# Patient Record
Sex: Female | Born: 1995 | Race: Black or African American | Hispanic: No | Marital: Married | Smoking: Never smoker
Health system: Southern US, Community
[De-identification: ages and names within clinical notes are randomized; demographics above are authoritative.]

## PROBLEM LIST (undated history)

## (undated) DIAGNOSIS — J45909 Unspecified asthma, uncomplicated: Secondary | ICD-10-CM

## (undated) DIAGNOSIS — D649 Anemia, unspecified: Secondary | ICD-10-CM

## (undated) DIAGNOSIS — IMO0001 Reserved for inherently not codable concepts without codable children: Secondary | ICD-10-CM

## (undated) DIAGNOSIS — O039 Complete or unspecified spontaneous abortion without complication: Secondary | ICD-10-CM

## (undated) HISTORY — PX: DILATION AND CURETTAGE OF UTERUS: SHX78

---

## 2014-03-23 DIAGNOSIS — O99213 Obesity complicating pregnancy, third trimester: Secondary | ICD-10-CM | POA: Insufficient documentation

## 2014-12-06 DIAGNOSIS — R7309 Other abnormal glucose: Secondary | ICD-10-CM | POA: Insufficient documentation

## 2015-01-21 DIAGNOSIS — O09292 Supervision of pregnancy with other poor reproductive or obstetric history, second trimester: Secondary | ICD-10-CM | POA: Insufficient documentation

## 2015-04-12 DIAGNOSIS — R002 Palpitations: Secondary | ICD-10-CM | POA: Insufficient documentation

## 2015-07-13 DIAGNOSIS — O09299 Supervision of pregnancy with other poor reproductive or obstetric history, unspecified trimester: Secondary | ICD-10-CM | POA: Insufficient documentation

## 2018-04-23 DIAGNOSIS — B9689 Other specified bacterial agents as the cause of diseases classified elsewhere: Secondary | ICD-10-CM | POA: Insufficient documentation

## 2018-12-17 ENCOUNTER — Encounter (HOSPITAL_COMMUNITY): Payer: Self-pay | Admitting: Emergency Medicine

## 2018-12-17 ENCOUNTER — Other Ambulatory Visit: Payer: Self-pay

## 2018-12-17 ENCOUNTER — Ambulatory Visit (HOSPITAL_COMMUNITY)
Admission: EM | Admit: 2018-12-17 | Discharge: 2018-12-17 | Disposition: A | Discharge status: Home / Self Care | Payer: Self-pay

## 2018-12-17 DIAGNOSIS — B373 Candidiasis of vulva and vagina: Secondary | ICD-10-CM | POA: Insufficient documentation

## 2018-12-17 DIAGNOSIS — B3731 Acute candidiasis of vulva and vagina: Secondary | ICD-10-CM

## 2018-12-17 MED ORDER — MICONAZOLE NITRATE 1200 & 2 MG & % VA KIT: 1.0000 | PACK | Freq: Once | VAGINAL | 0 refills | Status: AC

## 2018-12-17 NOTE — Discharge Instructions (Addendum)
Use the prescribed treatment for a vaginal yeast infection as directed.  Your vaginal test results are pending; we will call you if you need additional treatment.  If any of your test results are positive, your sexual partner may need to be treated.  Do not have sexual intercourse for 7 days.    Return here or follow-up with your primary care provider if you develop difficulty with urination, abdominal pain, worsening vaginal discharge, fever, or other concerning symptoms.

## 2018-12-17 NOTE — ED Provider Notes (Signed)
[facility]    CSN: [id number] Arrival date & time: 12/17/18  1134     History   Chief Complaint Chief Complaint  Patient presents with  . Vaginal Discharge    HPI Dorrene Bently is a 23 y.o. female.   Patient presents today with white vaginal discharge, external irritation x2 months.  She states she was previously treated for bacterial vaginosis and yeast infection March 2020.  She declines a vaginal exam today or treatment for bacterial vaginosis; she would like to wait for test results to come back before treatment other than for a yeast infection.  She is sexually active in a monogamous relationship x7 years.  LMP: 11/21/2018.  The history is provided by the patient.    History reviewed. No pertinent past medical history.  There are no active problems to display for this patient.   Past Surgical History:  Procedure Laterality Date  . CESAREAN SECTION    . DILATION AND CURETTAGE OF UTERUS      OB History   No obstetric history on file.      Home Medications    Prior to Admission medications   Medication Sig Start Date End Date Taking? Authorizing Provider  albuterol (VENTOLIN HFA) 108 (90 Base) MCG/ACT inhaler Inhale into the lungs every 6 (six) hours as needed for wheezing or shortness of breath.   Yes [provider]  miconazole (MONISTAT 1 COMBINATION PACK) kit Place 1 each vaginally once for 1 dose. 12/17/18 12/17/18  Sharion Balloon, NP    Family History History reviewed. No pertinent family history.  Social History Social History   Tobacco Use  . Smoking status: Never Smoker  Substance Use Topics  . Alcohol use: Never    Frequency: Never  . Drug use: Never     Allergies   Zofran [ondansetron hcl]   Review of Systems Review of Systems  Constitutional: Negative for chills and fever.  HENT: Negative for ear pain and sore throat.   Eyes: Negative for pain and visual disturbance.  Respiratory: Negative for cough and shortness of  breath.   Cardiovascular: Negative for chest pain and palpitations.  Gastrointestinal: Negative for abdominal pain, diarrhea and vomiting.  Genitourinary: Positive for vaginal discharge. Negative for dysuria, flank pain, frequency and hematuria.  Musculoskeletal: Negative for arthralgias and back pain.  Skin: Negative for color change and rash.  Neurological: Negative for seizures and syncope.  All other systems reviewed and are negative.    Physical Exam Triage Vital Signs ED Triage Vitals  Enc Vitals Group     BP      Pulse      Resp      Temp      Temp src      SpO2      Weight      Height      Head Circumference      Peak Flow      Pain Score      Pain Loc      Pain Edu?      Excl. in [place]?    No data found.  Updated Vital Signs BP 94/68 (BP Location: Left Arm) Comment (BP Location): large cuff  Pulse 89   Temp 98.5 F (36.9 C) (Oral)   Resp 18   LMP 11/21/2018 Comment: 12/17/2018 breast feeding  SpO2 97%   Visual Acuity Right Eye Distance:   Left Eye Distance:   Bilateral Distance:    Right Eye Near:   Left  Eye Near:    Bilateral Near:     Physical Exam Vitals signs and nursing note reviewed.  Constitutional:      General: She is not in acute distress.    Appearance: She is well-developed.  HENT:     Head: Normocephalic and atraumatic.  Eyes:     Conjunctiva/sclera: Conjunctivae normal.  Neck:     Musculoskeletal: Neck supple.  Cardiovascular:     Rate and Rhythm: Normal rate and regular rhythm.     Heart sounds: No murmur.  Pulmonary:     Effort: Pulmonary effort is normal. No respiratory distress.     Breath sounds: Normal breath sounds.  Abdominal:     Palpations: Abdomen is soft.     Tenderness: There is no abdominal tenderness. There is no right CVA tenderness, left CVA tenderness, guarding or rebound.  Genitourinary:    Comments: Patient refuses vaginal exam today. Skin:    General: Skin is warm and dry.  Neurological:     Mental  Status: She is alert.      UC Treatments / Results  Labs (all labs ordered are listed, but only abnormal results are displayed) Labs Reviewed  CERVICOVAGINAL ANCILLARY ONLY    EKG   Radiology No results found.  Procedures Procedures (including critical care time)  Medications Ordered in UC Medications - No data to display  Initial Impression / Assessment and Plan / UC Course  I have reviewed the triage vital signs and the nursing notes.  Pertinent labs & imaging results that were available during my care of the patient were reviewed by me and considered in my medical decision making (see chart for details).   Vaginal yeast infection.  Treating today with miconazole vaginal combination pack.  Vaginal swab for gonorrhea, chlamydia, and bacterial vaginosis pending; patient refuses treatment today until test results return.  Discussed with patient that she will need to staying from sexual intercourse x7 days that her sexual partner will need treatment if her test results returned positive.  Discussed that she should return here or follow-up with her primary care provider if she develops dysuria, abdominal pain, worsening vaginal discharge, fever, or other concerning symptoms.   Final Clinical Impressions(s) / UC Diagnoses   Final diagnoses:  Vaginal yeast infection     Discharge Instructions     Use the prescribed treatment for a vaginal yeast infection as directed.  Your vaginal test results are pending; we will call you if you need additional treatment.  If any of your test results are positive, your sexual partner may need to be treated.  Do not have sexual intercourse for 7 days.    Return here or follow-up with your primary care provider if you develop difficulty with urination, abdominal pain, worsening vaginal discharge, fever, or other concerning symptoms.        ED Prescriptions    Medication Sig Dispense Auth. Provider   miconazole (MONISTAT 1 COMBINATION  PACK) kit Place 1 each vaginally once for 1 dose. 1 each Sharion Balloon, NP     Controlled Substance Prescriptions [place] Controlled Substance Registry consulted? Not Applicable   Sharion Balloon, NP 12/17/18 [phone]

## 2018-12-17 NOTE — ED Triage Notes (Signed)
Patient reports vaginal discharge, odor and irritation-patient reports symptoms for 2 months.

## 2018-12-19 ENCOUNTER — Telehealth (HOSPITAL_COMMUNITY): Payer: Self-pay | Admitting: Emergency Medicine

## 2018-12-19 LAB — CERVICOVAGINAL ANCILLARY ONLY
Bacterial vaginitis: POSITIVE — AB
Chlamydia: NEGATIVE
Neisseria Gonorrhea: NEGATIVE
Trichomonas: NEGATIVE

## 2018-12-19 MED ORDER — FLUCONAZOLE 150 MG PO TABS: 150.0000 mg | ORAL_TABLET | Freq: Once | ORAL | 0 refills | Status: AC

## 2018-12-19 MED ORDER — METRONIDAZOLE 0.75 % VA GEL: 1.0000 | Freq: Every day | VAGINAL | 0 refills | Status: AC

## 2018-12-19 NOTE — Telephone Encounter (Signed)
Pt requesting metrogel and diflucan, states she does not want to wait. Okay to send per Dr. Meda Coffee.

## 2019-03-16 ENCOUNTER — Ambulatory Visit (HOSPITAL_COMMUNITY)
Admission: EM | Admit: 2019-03-16 | Discharge: 2019-03-16 | Disposition: A | Discharge status: Home / Self Care | Payer: Self-pay | Attending: Emergency Medicine | Admitting: Emergency Medicine

## 2019-03-16 ENCOUNTER — Other Ambulatory Visit: Payer: Self-pay

## 2019-03-16 ENCOUNTER — Encounter (HOSPITAL_COMMUNITY): Payer: Self-pay

## 2019-03-16 ENCOUNTER — Ambulatory Visit (INDEPENDENT_AMBULATORY_CARE_PROVIDER_SITE_OTHER): Payer: Self-pay

## 2019-03-16 DIAGNOSIS — M79641 Pain in right hand: Secondary | ICD-10-CM

## 2019-03-16 HISTORY — DX: Unspecified asthma, uncomplicated: J45.909

## 2019-03-16 NOTE — ED Provider Notes (Addendum)
[facility]    CSN: [id number] Arrival date & time: 03/16/19  0845      History   Chief Complaint Chief Complaint  Patient presents with  . Fall  . Hand Injury    HPI Kara Collins is a 23 y.o. female.   Patient presents with right hand pain after falling down steps at home 2 days ago.  She denies head injury or LOC.  She reports the pain is primarily at the base of her fifth finger.  She denies numbness, tingling, weakness.  LMP: 02/17/2019, breastfeeding.  The history is provided by the patient.    Past Medical History:  Diagnosis Date  . Asthma     There are no active problems to display for this patient.   Past Surgical History:  Procedure Laterality Date  . CESAREAN SECTION    . DILATION AND CURETTAGE OF UTERUS      OB History   No obstetric history on file.      Home Medications    Prior to Admission medications   Medication Sig Start Date End Date Taking? Authorizing Provider  albuterol (VENTOLIN HFA) 108 (90 Base) MCG/ACT inhaler Inhale into the lungs every 6 (six) hours as needed for wheezing or shortness of breath.    [provider]    Family History History reviewed. No pertinent family history.  Social History Social History   Tobacco Use  . Smoking status: Never Smoker  . Smokeless tobacco: Never Used  Substance Use Topics  . Alcohol use: Never    Frequency: Never  . Drug use: Never     Allergies   Zofran [ondansetron hcl]   Review of Systems Review of Systems  Constitutional: Negative for chills and fever.  HENT: Negative for ear pain and sore throat.   Eyes: Negative for pain and visual disturbance.  Respiratory: Negative for cough and shortness of breath.   Cardiovascular: Negative for chest pain and palpitations.  Gastrointestinal: Negative for abdominal pain and vomiting.  Genitourinary: Negative for dysuria and hematuria.  Musculoskeletal: Positive for arthralgias. Negative for back pain.  Skin:  Negative for color change and rash.  Neurological: Negative for seizures and syncope.  All other systems reviewed and are negative.    Physical Exam Triage Vital Signs ED Triage Vitals  Enc Vitals Group     BP 03/16/19 0859 (!) 147/88     Pulse Rate 03/16/19 0859 70     Resp 03/16/19 0859 18     Temp 03/16/19 0859 98.2 F (36.8 C)     Temp Source 03/16/19 0859 Oral     SpO2 03/16/19 0859 96 %     Weight 03/16/19 0904 230 lb (104.3 kg)     Height --      Head Circumference --      Peak Flow --      Pain Score 03/16/19 0904 7     Pain Loc --      Pain Edu? --      Excl. in [place]? --    No data found.  Updated Vital Signs BP (!) 147/88 (BP Location: Left Arm)   Pulse 70   Temp 98.2 F (36.8 C) (Oral)   Resp 18   Wt 230 lb (104.3 kg)   LMP 02/16/2019 (Exact Date)   SpO2 96%   Visual Acuity Right Eye Distance:   Left Eye Distance:   Bilateral Distance:    Right Eye Near:   Left Eye Near:  Bilateral Near:     Physical Exam Vitals signs and nursing note reviewed.  Constitutional:      General: She is not in acute distress.    Appearance: She is well-developed.  HENT:     Head: Normocephalic and atraumatic.  Eyes:     Conjunctiva/sclera: Conjunctivae normal.     Pupils: Pupils are equal, round, and reactive to light.  Neck:     Musculoskeletal: Neck supple.  Cardiovascular:     Rate and Rhythm: Normal rate and regular rhythm.     Heart sounds: No murmur.  Pulmonary:     Effort: Pulmonary effort is normal. No respiratory distress.     Breath sounds: Normal breath sounds.  Abdominal:     Palpations: Abdomen is soft.     Tenderness: There is no abdominal tenderness.  Musculoskeletal: Normal range of motion.        General: Tenderness present. No swelling or deformity.     Comments: Right hand tender to palpation along the side from 5th finger to wrist.    Skin:    General: Skin is warm and dry.  Neurological:     General: No focal deficit present.      Mental Status: She is alert and oriented to person, place, and time.     Sensory: No sensory deficit.      UC Treatments / Results  Labs (all labs ordered are listed, but only abnormal results are displayed) Labs Reviewed - No data to display  EKG   Radiology Dg Hand Complete Right  Result Date: 03/16/2019 CLINICAL DATA:  Pain EXAM: RIGHT HAND - COMPLETE 3+ VIEW COMPARISON:  None. FINDINGS: There is no evidence of fracture or dislocation. There is no evidence of arthropathy or other focal bone abnormality. Soft tissues are unremarkable. IMPRESSION: Negative. Electronically Signed   By: Constance Holster M.D.   On: 03/16/2019 09:37    Procedures Procedures (including critical care time)  Medications Ordered in UC Medications - No data to display  Initial Impression / Assessment and Plan / UC Course  I have reviewed the triage vital signs and the nursing notes.  Pertinent labs & imaging results that were available during my care of the patient were reviewed by me and considered in my medical decision making (see chart for details).    Right hand pain.  Xray negative.  Treating with RICE; Tylenol as needed.  Instructed patient to follow-up with orthopedics if her pain continues.  Patient agrees to plan of care.     Final Clinical Impressions(s) / UC Diagnoses   Final diagnoses:  Right hand pain     Discharge Instructions     Your xray was negative.    Rest and elevate your hand.  Apply ice packs 2-3 times per day for up to 15-20 each time.  Take Tylenol as needed.    Follow up with Orthopedics as needed if your pain does not improve.        ED Prescriptions    None     PDMP not reviewed this encounter.   Sharion Balloon, NP 03/16/19 [place], [place], NP 03/16/19 [phone]

## 2019-03-16 NOTE — Discharge Instructions (Signed)
Your xray was negative.    Rest and elevate your hand.  Apply ice packs 2-3 times per day for up to 15-20 each time.  Take Tylenol as needed.    Follow up with Orthopedics as needed if your pain does not improve.

## 2019-03-16 NOTE — ED Triage Notes (Signed)
Pt states she was walking down the steps at home when she fell down and her right hand bent back causing her pain.

## 2019-07-18 ENCOUNTER — Ambulatory Visit (HOSPITAL_COMMUNITY)
Admission: EM | Admit: 2019-07-18 | Discharge: 2019-07-18 | Disposition: A | Discharge status: Home / Self Care | Payer: Self-pay | Attending: Emergency Medicine | Admitting: Emergency Medicine

## 2019-07-18 ENCOUNTER — Other Ambulatory Visit: Payer: Self-pay

## 2019-07-18 ENCOUNTER — Encounter (HOSPITAL_COMMUNITY): Payer: Self-pay | Admitting: *Deleted

## 2019-07-18 DIAGNOSIS — R1033 Periumbilical pain: Secondary | ICD-10-CM

## 2019-07-18 MED ORDER — IBUPROFEN 600 MG PO TABS: 600.0000 mg | ORAL_TABLET | Freq: Four times a day (QID) | ORAL | 0 refills | Status: DC | PRN

## 2019-07-18 NOTE — Discharge Instructions (Addendum)
Try 600 mg of ibuprofen combined with 1 g of Tylenol 3-4 times a day as needed for pain.  The abdominal binder may help as well.  Follow-up with [place] [facility] as soon as you can, go to the ER if you get worse.  Below is a list of primary care practices who are taking new patients for you to follow-up with.  [facility] internal medicine clinic Ground Floor - [facility], [address] [place], [place], [place] [address] [phone]  [facility] at [facility] [address] [place], [place] [address] [phone]  [facility] [address] [place], [place] [address] [phone]  [facility] [phone] [address] [place] [address]  [facility]: [address] [place] [address]  [phone]  [facility]: [address] [place] [address]   [phone]  [facility]: [address] [place] [address]  [phone]  [facility] : [address] [place] [address] [phone]  [facility]: [address] [place] [address] [phone]  [facility]: [address] [place] [address] [phone]  Dr. Oneal Grout [address] [facility] [place] [address]  [phone]  Dr. Jackie Plum, [facility]. [address] [place], [place] [address]  [phone]  Go to www.goodrx.com to look up your medications. This will give you a list of where you can find your prescriptions at the most affordable prices. Or ask the pharmacist what the cash price is, or if they have any other discount programs available to help make your medication more affordable. This can be less expensive than what you  would pay with insurance.

## 2019-07-18 NOTE — ED Triage Notes (Signed)
Reports having intermittent sharp umbilical pains with position changes since early 2020, following C-section in Nov 2019.  Has not been diagnosed with hernia.  States seems to be small area of swelling/bulging to umbilicus - states bulge is now getting harder over past few months.

## 2019-07-18 NOTE — ED Provider Notes (Signed)
HPI  SUBJECTIVE:  Kara Collins is a 24 y.o. female who presents with sharp, intermittent, stabbing, seconds long umbilical pain since early 2020.  It does not radiate or migrate.  States that she had a C-section in 04/2018 and her symptoms started after she was cleared 6-8 weeks later.  She reports a "hard bump" at the umbilicus for the past several months.  It has not changed today.  It has not changed in size since it appeared.  No vomiting, fevers, diarrhea, distention.  Last bowel movement was 2 days ago, but states that she is normally irregular and this is within normal stooling pattern for her.  No back, pelvic pain.  She tried an abdominal binder without improvement in her symptoms.  Symptoms are worse when she lies on her back or stomach, when she has a lot of gas, or when she does exercises that are specific to abdominal rectus separation.  She has a past medical history of low transverse C-section with resulting abdominis rectus separation.  No other abdominal surgeries.  She has history of asthma.  No History of pancreatitis, gallbladder disease, alcohol use, excessive NSAID use.  LMP: 1/16.  Denies possibility of being pregnant.  PMD: None.  Past Medical History:  Diagnosis Date  . Asthma     Past Surgical History:  Procedure Laterality Date  . CESAREAN SECTION    . DILATION AND CURETTAGE OF UTERUS      Family History  Problem Relation Age of Onset  . Other Mother   . Healthy Father     Social History   Tobacco Use  . Smoking status: Never Smoker  . Smokeless tobacco: Never Used  Substance Use Topics  . Alcohol use: Never  . Drug use: Never    No current facility-administered medications for this encounter.  Current Outpatient Medications:  .  albuterol (VENTOLIN HFA) 108 (90 Base) MCG/ACT inhaler, Inhale into the lungs every 6 (six) hours as needed for wheezing or shortness of breath., Disp: , Rfl:  .  ibuprofen (ADVIL) 600 MG tablet, Take 1 tablet (600 mg total)  by mouth every 6 (six) hours as needed., Disp: 30 tablet, Rfl: 0  Allergies  Allergen Reactions  . Cephalexin Nausea And Vomiting  . Zofran [Ondansetron Hcl] Nausea And Vomiting     ROS  As noted in HPI.   Physical Exam  BP 115/66   Pulse 75   Temp 98.4 F (36.9 C) (Oral)   Resp 16   LMP 06/27/2019 (Exact Date)   SpO2 100%   Constitutional: Well developed, well nourished, no acute distress Eyes:  EOMI, conjunctiva normal bilaterally HENT: Normocephalic, atraumatic,mucus membranes moist Respiratory: Normal inspiratory effort Cardiovascular: Normal rate GI: Normal appearance, soft.  Positive umbilical tenderness. Small hard umbilical mass.  No appreciable hernia with standing or with Valsalva.  No surrounding erythema, edema, induration.  Active bowel sounds.  No rebound, guarding.  No other abdominal tenderness.  Mild tenderness at the C-section scar. Back: No CVAT skin: No rash, skin intact Musculoskeletal: no deformities Neurologic: Alert & oriented x 3, no focal neuro deficits Psychiatric: Speech and behavior appropriate   ED Course   Medications - No data to display  No orders of the defined types were placed in this encounter.   No results found for this or any previous visit (from the past 24 hour(s)). No results found.  ED Clinical Impression  1. Umbilical pain      ED Assessment/Plan  Patient may have a small  umbilical hernia, but it sounds as if it is stable-has been present since early 2020 and has not changed over the past few months..  Abdomen is benign today.  No evidence of pancreatitis, incarceration, strangulation.  will refer to [place] [facility] for further evaluation and possible imaging to evaluate the presence or absence of an umbilical hernia.  We will also provide primary care list for routine care.  Discussed MDM, treatment plan, and plan for follow-up with patient. Discussed sn/sx that should prompt return to the ED. patient agrees with  plan.   Meds ordered this encounter  Medications  . ibuprofen (ADVIL) 600 MG tablet    Sig: Take 1 tablet (600 mg total) by mouth every 6 (six) hours as needed.    Dispense:  30 tablet    Refill:  0    *This clinic note was created using [employer]. Therefore, there may be occasional mistakes despite careful proofreading.   ?   Melynda Ripple, MD 07/18/19 1733

## 2019-09-27 ENCOUNTER — Other Ambulatory Visit: Payer: Self-pay

## 2019-09-27 ENCOUNTER — Encounter (HOSPITAL_COMMUNITY): Payer: Self-pay

## 2019-09-27 ENCOUNTER — Ambulatory Visit (HOSPITAL_COMMUNITY)
Admission: EM | Admit: 2019-09-27 | Discharge: 2019-09-27 | Disposition: A | Discharge status: Home / Self Care | Payer: Self-pay | Attending: Family Medicine | Admitting: Family Medicine

## 2019-09-27 DIAGNOSIS — R5383 Other fatigue: Secondary | ICD-10-CM | POA: Insufficient documentation

## 2019-09-27 DIAGNOSIS — Z20822 Contact with and (suspected) exposure to covid-19: Secondary | ICD-10-CM | POA: Insufficient documentation

## 2019-09-27 DIAGNOSIS — J45909 Unspecified asthma, uncomplicated: Secondary | ICD-10-CM | POA: Insufficient documentation

## 2019-09-27 DIAGNOSIS — R0602 Shortness of breath: Secondary | ICD-10-CM | POA: Insufficient documentation

## 2019-09-27 LAB — CBC WITH DIFFERENTIAL/PLATELET
Abs Immature Granulocytes: 0.01 10*3/uL (ref 0.00–0.07)
Basophils Absolute: 0 10*3/uL (ref 0.0–0.1)
Basophils Relative: 0 %
Eosinophils Absolute: 0 10*3/uL (ref 0.0–0.5)
Eosinophils Relative: 1 %
HCT: 38.2 % (ref 36.0–46.0)
Hemoglobin: 11.6 g/dL — ABNORMAL LOW (ref 12.0–15.0)
Immature Granulocytes: 0 %
Lymphocytes Relative: 26 %
Lymphs Abs: 1.3 10*3/uL (ref 0.7–4.0)
MCH: 25.1 pg — ABNORMAL LOW (ref 26.0–34.0)
MCHC: 30.4 g/dL (ref 30.0–36.0)
MCV: 82.7 fL (ref 80.0–100.0)
Monocytes Absolute: 0.3 10*3/uL (ref 0.1–1.0)
Monocytes Relative: 7 %
Neutro Abs: 3.2 10*3/uL (ref 1.7–7.7)
Neutrophils Relative %: 66 %
Platelets: 320 10*3/uL (ref 150–400)
RBC: 4.62 MIL/uL (ref 3.87–5.11)
RDW: 13.9 % (ref 11.5–15.5)
WBC: 4.9 10*3/uL (ref 4.0–10.5)
nRBC: 0 % (ref 0.0–0.2)

## 2019-09-27 MED ORDER — ALBUTEROL SULFATE HFA 108 (90 BASE) MCG/ACT IN AERS: 2.0000 | INHALATION_SPRAY | RESPIRATORY_TRACT | 0 refills | Status: AC | PRN

## 2019-09-27 NOTE — ED Triage Notes (Signed)
C/o shortness of breath in the week leading up to and the week of her menses. Reports she has irregular periods, and become very short of breath to the point she can't have a conversation with someone. Also reports she gets cold easily and feels fatigued as well. Patient reports her LMP was either 3/15 or 3/22.

## 2019-09-27 NOTE — Discharge Instructions (Addendum)
We are checking your iron levels today to rule out anemia as the cause of your fatigue.  Your COVID test is pending.  You should self quarantine until the test result is back.    Take Tylenol as needed for fever or discomfort.  Rest and keep yourself hydrated.    Go to the emergency department if you develop shortness of breath, severe diarrhea, high fever not relieved by Tylenol or ibuprofen, or other concerning symptoms.

## 2019-09-27 NOTE — ED Provider Notes (Signed)
[facility]    CSN: [id number] Arrival date & time: 09/27/19  1050      History   Chief Complaint Chief Complaint  Patient presents with  . Shortness of Breath    HPI Kara Collins is a 24 y.o. female.   Patient reports that she has been experiencing shortness of breath for the week before and the week of her menstrual cycle.  She reports that this has been going on for the last 3 months, she finished breast-feeding her youngest child 3 months ago and began having periods then.  Patient has medical history significant for asthma.  She does not take any medications for this.  Patient is not taking any birth control at this time.  Patient reports that she is getting so short of breath at work, she is on the phone all day, that she cannot complete her tasks at work.  Patient also reports easily fatigued and feeling cold easily.  She is inquiring about checking her hemoglobin today.  Reports that her last menstrual period was either on 08/24/2019 or 08/31/2018.  Denies headache, cough, nausea, vomiting, diarrhea, chills, body aches, rash, fever, other symptoms.  ROS per HPI  The history is provided by the patient.    Past Medical History:  Diagnosis Date  . Asthma     There are no problems to display for this patient.   Past Surgical History:  Procedure Laterality Date  . CESAREAN SECTION    . DILATION AND CURETTAGE OF UTERUS      OB History   No obstetric history on file.      Home Medications    Prior to Admission medications   Medication Sig Start Date End Date Taking? Authorizing Provider  albuterol (VENTOLIN HFA) 108 (90 Base) MCG/ACT inhaler Inhale 2 puffs into the lungs every 4 (four) hours as needed for wheezing or shortness of breath. 09/27/19   Moshe Cipro, NP  ibuprofen (ADVIL) 600 MG tablet Take 1 tablet (600 mg total) by mouth every 6 (six) hours as needed. 07/18/19   Domenick Gong, MD    Family History Family History  Problem  Relation Age of Onset  . Other Mother   . Healthy Father     Social History Social History   Tobacco Use  . Smoking status: Never Smoker  . Smokeless tobacco: Never Used  Substance Use Topics  . Alcohol use: Never  . Drug use: Never     Allergies   Cephalexin and Zofran [ondansetron hcl]   Review of Systems Review of Systems   Physical Exam Triage Vital Signs ED Triage Vitals  Enc Vitals Group     BP 09/27/19 1158 112/69     Pulse Rate 09/27/19 1158 65     Resp 09/27/19 1158 (!) 24     Temp 09/27/19 1158 98.7 F (37.1 C)     Temp Source 09/27/19 1158 Oral     SpO2 09/27/19 1158 100 %     Weight --      Height --      Head Circumference --      Peak Flow --      Pain Score 09/27/19 1156 0     Pain Loc --      Pain Edu? --      Excl. in GC? --    No data found.  Updated Vital Signs BP 112/69 (BP Location: Left Arm)   Pulse 65   Temp 98.7 F (37.1 C) (Oral)  Resp (!) 24   LMP 08/24/2019 (Within Days)   SpO2 100%   Visual Acuity Right Eye Distance:   Left Eye Distance:   Bilateral Distance:    Right Eye Near:   Left Eye Near:    Bilateral Near:     Physical Exam Vitals and nursing note reviewed.  Constitutional:      General: She is not in acute distress.    Appearance: She is well-developed. She is obese. She is not ill-appearing.  HENT:     Head: Normocephalic and atraumatic.     Mouth/Throat:     Mouth: Mucous membranes are moist.     Pharynx: Oropharynx is clear.  Eyes:     Extraocular Movements: Extraocular movements intact.     Conjunctiva/sclera: Conjunctivae normal.     Pupils: Pupils are equal, round, and reactive to light.  Neck:     Thyroid: No thyromegaly.     Vascular: No JVD.  Cardiovascular:     Rate and Rhythm: Normal rate and regular rhythm.     Pulses: Normal pulses.     Heart sounds: Normal heart sounds. No murmur.  Pulmonary:     Effort: Pulmonary effort is normal. Tachypnea present. No bradypnea, accessory muscle  usage or respiratory distress.     Breath sounds: Normal breath sounds. No stridor. No decreased breath sounds, wheezing, rhonchi or rales.  Chest:     Chest wall: No mass, deformity, tenderness, crepitus or edema. There is no dullness to percussion.  Abdominal:     Palpations: Abdomen is soft.     Tenderness: There is no abdominal tenderness.  Musculoskeletal:        General: Normal range of motion.     Cervical back: Normal range of motion and neck supple.  Lymphadenopathy:     Cervical: No cervical adenopathy.  Skin:    General: Skin is warm and dry.     Capillary Refill: Capillary refill takes less than 2 seconds.  Neurological:     General: No focal deficit present.     Mental Status: She is alert and oriented to person, place, and time.  Psychiatric:        Mood and Affect: Mood normal.        Behavior: Behavior normal.      UC Treatments / Results  Labs (all labs ordered are listed, but only abnormal results are displayed) Labs Reviewed  CBC WITH DIFFERENTIAL/PLATELET - Abnormal; Notable for the following components:      Result Value   Hemoglobin 11.6 (*)    MCH 25.1 (*)    All other components within normal limits  SARS CORONAVIRUS 2 (TAT 6-24 HRS)    EKG   Radiology No results found.  Procedures Procedures (including critical care time)  Medications Ordered in UC Medications - No data to display  Initial Impression / Assessment and Plan / UC Course  I have reviewed the triage vital signs and the nursing notes.  Pertinent labs & imaging results that were available during my care of the patient were reviewed by me and considered in my medical decision making (see chart for details).     Shortness of breath/fatigue: Present intermittently for the last 3 months.  We will check a CBC today to rule out anemia.  Instructed patient that she can go ahead and take over-the-counter iron supplements.  Instructed patient that this can make her constipated, so she  needs to drink plenty of water and increase fiber in her diet.  She may use MiraLAX as needed for constipation.  Albuterol inhaler prescribed for shortness of breath.  Unsure of etiology, possibly hormonal given the timing of her symptoms.  Instructed patient that she should follow-up with gynecology, as increased hormones can increase inflammation in the body.  Patient instructed to follow-up with the ER for worsening shortness of breath, high fever, extreme fatigue, other concerning symptoms.  Patient verbalizes understanding and is in agreement with treatment plan. Final Clinical Impressions(s) / UC Diagnoses   Final diagnoses:  SOB (shortness of breath)  Other fatigue     Discharge Instructions     We are checking your iron levels today to rule out anemia as the cause of your fatigue.  Your COVID test is pending.  You should self quarantine until the test result is back.    Take Tylenol as needed for fever or discomfort.  Rest and keep yourself hydrated.    Go to the emergency department if you develop shortness of breath, severe diarrhea, high fever not relieved by Tylenol or ibuprofen, or other concerning symptoms.       ED Prescriptions    Medication Sig Dispense Auth. Provider   albuterol (VENTOLIN HFA) 108 (90 Base) MCG/ACT inhaler Inhale 2 puffs into the lungs every 4 (four) hours as needed for wheezing or shortness of breath. 18 g Moshe Cipro, NP     PDMP not reviewed this encounter.   Moshe Cipro, NP 09/28/19 2103

## 2019-09-28 LAB — SARS CORONAVIRUS 2 (TAT 6-24 HRS): SARS Coronavirus 2: NEGATIVE

## 2019-12-12 ENCOUNTER — Other Ambulatory Visit: Payer: Self-pay

## 2019-12-12 ENCOUNTER — Ambulatory Visit (HOSPITAL_COMMUNITY): Admission: EM | Admit: 2019-12-12 | Discharge: 2019-12-12 | Discharge status: Against Medical Advice | Payer: Self-pay

## 2019-12-12 NOTE — ED Notes (Signed)
Pt thought she had medicaid and when told during registration she did not, she decided to leave because she did not want a bill or to do self pay.

## 2019-12-24 ENCOUNTER — Ambulatory Visit (HOSPITAL_COMMUNITY)
Admission: EM | Admit: 2019-12-24 | Discharge: 2019-12-24 | Disposition: A | Discharge status: Home / Self Care | Payer: Self-pay

## 2019-12-24 ENCOUNTER — Other Ambulatory Visit: Payer: Self-pay

## 2019-12-25 ENCOUNTER — Other Ambulatory Visit: Payer: Self-pay

## 2019-12-25 ENCOUNTER — Encounter (HOSPITAL_COMMUNITY): Payer: Self-pay

## 2019-12-25 ENCOUNTER — Ambulatory Visit (HOSPITAL_COMMUNITY)
Admission: EM | Admit: 2019-12-25 | Discharge: 2019-12-25 | Disposition: A | Discharge status: Home / Self Care | Payer: Self-pay

## 2019-12-25 ENCOUNTER — Ambulatory Visit (INDEPENDENT_AMBULATORY_CARE_PROVIDER_SITE_OTHER): Payer: Self-pay

## 2019-12-25 DIAGNOSIS — Z3202 Encounter for pregnancy test, result negative: Secondary | ICD-10-CM

## 2019-12-25 DIAGNOSIS — R42 Dizziness and giddiness: Secondary | ICD-10-CM

## 2019-12-25 DIAGNOSIS — R0602 Shortness of breath: Secondary | ICD-10-CM

## 2019-12-25 DIAGNOSIS — R140 Abdominal distension (gaseous): Secondary | ICD-10-CM

## 2019-12-25 DIAGNOSIS — R109 Unspecified abdominal pain: Secondary | ICD-10-CM

## 2019-12-25 LAB — POCT URINALYSIS DIP (DEVICE)
Bilirubin Urine: NEGATIVE
Glucose, UA: NEGATIVE mg/dL
Hgb urine dipstick: NEGATIVE
Ketones, ur: NEGATIVE mg/dL
Leukocytes,Ua: NEGATIVE
Nitrite: NEGATIVE
Protein, ur: NEGATIVE mg/dL
Specific Gravity, Urine: 1.01 (ref 1.005–1.030)
Urobilinogen, UA: 0.2 mg/dL (ref 0.0–1.0)
pH: 7.5 (ref 5.0–8.0)

## 2019-12-25 LAB — POC URINE PREG, ED: Preg Test, Ur: NEGATIVE

## 2019-12-25 NOTE — ED Provider Notes (Signed)
[facility]    CSN: [id number] Arrival date & time: 12/25/19  0801      History   Chief Complaint Chief Complaint  Patient presents with  . Shortness of Breath  . Abdominal Pain    HPI Kara Collins is a 24 y.o. female.   Patient is a 24 year old female who presents today with acute onset of mild shortness of breath since yesterday.  This is been intermittent.  Reporting associated with possible anxiety.  She has had anxiety attacks off and on over the years.  Had chest pressure for approximately 1 hour and was able to fall asleep.  She has been using her mother-in-law's inhaler for shortness of breath without any relief.  Current chest heaviness 5 out of 10.  Denies any cough, congestion, runny nose, sore throat, ear pain, nausea, vomiting, diarrhea, fever or chills.  No acute distress.  Had an abortion in June by oral pill and then had clotting after this. Patient's last menstrual period was 12/16/2019 (approximate).  She is also had some constipation and feels bloated.  No dysuria, hematuria or urinary frequency.  No vaginal discharge, itching or irritation.  ROS per HPI       Past Medical History:  Diagnosis Date  . Asthma     There are no problems to display for this patient.   Past Surgical History:  Procedure Laterality Date  . CESAREAN SECTION    . DILATION AND CURETTAGE OF UTERUS      OB History   No obstetric history on file.      Home Medications    Prior to Admission medications   Medication Sig Start Date End Date Taking? Authorizing Provider  Acetaminophen 325 MG CAPS Take by mouth.   Yes [provider]  albuterol (VENTOLIN HFA) 108 (90 Base) MCG/ACT inhaler Inhale 2 puffs into the lungs every 4 (four) hours as needed for wheezing or shortness of breath. 09/27/19  Yes Moshe Cipro, NP  ibuprofen (ADVIL) 600 MG tablet Take 1 tablet (600 mg total) by mouth every 6 (six) hours as needed. 07/18/19   Domenick Gong, MD     Family History Family History  Problem Relation Age of Onset  . Other Mother   . Healthy Father     Social History Social History   Tobacco Use  . Smoking status: Never Smoker  . Smokeless tobacco: Never Used  Vaping Use  . Vaping Use: Never used  Substance Use Topics  . Alcohol use: Never  . Drug use: Never     Allergies   Cephalexin, Pollen extract, Sulfa antibiotics, and Zofran [ondansetron hcl]   Review of Systems Review of Systems   Physical Exam Triage Vital Signs ED Triage Vitals [12/25/19 0815]  Enc Vitals Group     BP 109/75     Pulse Rate 84     Resp 20     Temp 98.9 F (37.2 C)     Temp Source Oral     SpO2 100 %     Weight      Height      Head Circumference      Peak Flow      Pain Score 0     Pain Loc      Pain Edu?      Excl. in GC?    No data found.  Updated Vital Signs BP 109/75 (BP Location: Left Arm)   Pulse 84   Temp 98.9 F (37.2 C) (Oral)  Resp 20   LMP 12/16/2019 (Approximate)   SpO2 100%   Visual Acuity Right Eye Distance:   Left Eye Distance:   Bilateral Distance:    Right Eye Near:   Left Eye Near:    Bilateral Near:     Physical Exam Vitals and nursing note reviewed.  Constitutional:      General: She is not in acute distress.    Appearance: Normal appearance. She is not ill-appearing, toxic-appearing or diaphoretic.  HENT:     Head: Normocephalic.     Nose: Nose normal.     Mouth/Throat:     Pharynx: Oropharynx is clear.  Eyes:     Conjunctiva/sclera: Conjunctivae normal.  Cardiovascular:     Rate and Rhythm: Normal rate and regular rhythm.  Pulmonary:     Effort: Pulmonary effort is normal.     Breath sounds: Normal breath sounds.  Musculoskeletal:        General: Normal range of motion.     Cervical back: Normal range of motion.  Skin:    General: Skin is warm and dry.     Findings: No rash.  Neurological:     Mental Status: She is alert.  Psychiatric:        Mood and Affect: Mood  normal.      UC Treatments / Results  Labs (all labs ordered are listed, but only abnormal results are displayed) Labs Reviewed  POC URINE PREG, ED  POCT URINALYSIS DIP (DEVICE)    EKG   Radiology DG Abd Acute W/Chest  Result Date: 12/25/2019 CLINICAL DATA:  Chest pain, shortness of breath, bloating and dizziness since 12/21/2019. EXAM: DG ABDOMEN ACUTE W/ 1V CHEST COMPARISON:  None. FINDINGS: Single-view of the chest demonstrates clear lungs and normal heart size. No pneumothorax or pleural fluid. No acute or focal bony abnormality. Two views of the abdomen show no free intraperitoneal air. The bowel gas pattern is normal. No abnormal abdominal calcification or focal bony abnormality. IMPRESSION: Normal exam. Electronically Signed   By: Drusilla Kanner M.D.   On: 12/25/2019 09:34    Procedures Procedures (including critical care time)  Medications Ordered in UC Medications - No data to display  Initial Impression / Assessment and Plan / UC Course  I have reviewed the triage vital signs and the nursing notes.  Pertinent labs & imaging results that were available during my care of the patient were reviewed by me and considered in my medical decision making (see chart for details).     Shortness of breath and abdominal bloating X-ray without any acute findings. Exam completely normal Vital signs stable and she is nontoxic or ill-appearing Urine without pregnancy or infection Believe some of her symptoms could be anxiety related. MiraLAX for constipation Recommend follow-up with primary care and OB/GYN as needed Final Clinical Impressions(s) / UC Diagnoses   Final diagnoses:  SOB (shortness of breath)     Discharge Instructions     Your x-ray was normal. You can do MiraLAX for constipation Urine was without pregnancy and infection Have given you contact your primary care to follow-up for continued anxiety and OB/GYN care      ED Prescriptions    None      PDMP not reviewed this encounter.   Dahlia Byes A, NP 12/25/19 1500

## 2019-12-25 NOTE — ED Triage Notes (Addendum)
Pt c/o acute SOB onset yesterday; pt states "my mind started racing and then I felt short of breath". States h/o "self-diagnosed anxiety". Pt also states she had central chest "tightness" for approx one hour then went to sleep. Pt states she used her mother-in-law's albuterol inhaler for SOB last night w/o feeling better. Out of her albuterol inhaler.   Denies diaphoresis, n/v, back/jaw/arm pain, slurred speech. Denies any recent illness, no congestion, runny nose, sore throat, ear pain, n/v/d, fever, or chills.  Reports chest "heaviness" at present 5/10 scale.  Pt speaking full sentences w/o difficulty; bilateral breath sounds CTA; v/s stable.   Also reports intermittent lower abdominal pain since "taking abortion pill" in June.

## 2019-12-25 NOTE — Discharge Instructions (Addendum)
Your x-ray was normal. You can do MiraLAX for constipation Urine was without pregnancy and infection Have given you contact your primary care to follow-up for continued anxiety and OB/GYN care

## 2020-01-23 ENCOUNTER — Encounter (HOSPITAL_COMMUNITY): Payer: Self-pay

## 2020-01-23 ENCOUNTER — Other Ambulatory Visit: Payer: Self-pay

## 2020-01-23 ENCOUNTER — Ambulatory Visit (HOSPITAL_COMMUNITY)
Admission: EM | Admit: 2020-01-23 | Discharge: 2020-01-23 | Disposition: A | Discharge status: Home / Self Care | Payer: 59 | Attending: Urgent Care | Admitting: Urgent Care

## 2020-01-23 DIAGNOSIS — R110 Nausea: Secondary | ICD-10-CM

## 2020-01-23 DIAGNOSIS — R42 Dizziness and giddiness: Secondary | ICD-10-CM

## 2020-01-23 DIAGNOSIS — Z3202 Encounter for pregnancy test, result negative: Secondary | ICD-10-CM | POA: Diagnosis not present

## 2020-01-23 HISTORY — DX: Reserved for inherently not codable concepts without codable children: IMO0001

## 2020-01-23 HISTORY — DX: Complete or unspecified spontaneous abortion without complication: O03.9

## 2020-01-23 LAB — POCT URINALYSIS DIPSTICK, ED / UC
Bilirubin Urine: NEGATIVE
Glucose, UA: NEGATIVE mg/dL
Hgb urine dipstick: NEGATIVE
Ketones, ur: NEGATIVE mg/dL
Nitrite: NEGATIVE
Protein, ur: NEGATIVE mg/dL
Specific Gravity, Urine: <=1.005 (ref 1.005–1.030)
Urobilinogen, UA: 0.2 mg/dL (ref 0.0–1.0)
pH: 6 (ref 5.0–8.0)

## 2020-01-23 LAB — POC URINE PREG, ED: Preg Test, Ur: NEGATIVE

## 2020-01-23 MED ORDER — MECLIZINE HCL 12.5 MG PO TABS: 12.5000 mg | ORAL_TABLET | Freq: Three times a day (TID) | ORAL | 0 refills | Status: DC | PRN

## 2020-01-23 NOTE — ED Triage Notes (Signed)
Pt presents with dizziness and nausea for past few days.

## 2020-01-23 NOTE — ED Provider Notes (Signed)
[facility]   MRN: [id number] DOB: Sep 16, 1995  Subjective:   Kara Collins is a 24 y.o. female presenting for several day history of intermittent nausea without vomiting, intermittent dizziness.  Patient states that dizziness is transient but wants to make sure that she does not have any signs of severe infection or disease state.  Patient has a history of asthma, uses albuterol inhaler as needed.  LMP was 01/11/2020.  Denies headache, confusion, sore throat, cough, chest pain, shortness of breath, heart racing, belly pain, dysuria, hematuria, urinary frequency.  Denies smoking cigarettes.  No current facility-administered medications for this encounter.  Current Outpatient Medications:  .  Acetaminophen 325 MG CAPS, Take by mouth., Disp: , Rfl:  .  albuterol (VENTOLIN HFA) 108 (90 Base) MCG/ACT inhaler, Inhale 2 puffs into the lungs every 4 (four) hours as needed for wheezing or shortness of breath., Disp: 18 g, Rfl: 0 .  ibuprofen (ADVIL) 600 MG tablet, Take 1 tablet (600 mg total) by mouth every 6 (six) hours as needed., Disp: 30 tablet, Rfl: 0   Allergies  Allergen Reactions  . Cephalexin Nausea And Vomiting  . Pollen Extract Other (See Comments)    sneezing  . Sulfa Antibiotics Nausea And Vomiting  . Zofran [Ondansetron Hcl] Nausea And Vomiting    Past Medical History:  Diagnosis Date  . Abortion   . Asthma      Past Surgical History:  Procedure Laterality Date  . CESAREAN SECTION    . DILATION AND CURETTAGE OF UTERUS      Family History  Problem Relation Age of Onset  . Other Mother   . Healthy Father     Social History   Tobacco Use  . Smoking status: Never Smoker  . Smokeless tobacco: Never Used  Vaping Use  . Vaping Use: Never used  Substance Use Topics  . Alcohol use: Never  . Drug use: Never    ROS   Objective:   Vitals: BP 114/65 (BP Location: Left Arm)   Pulse 88   Temp 99.1 F (37.3 C) (Oral)   Resp 16   LMP 01/11/2020   SpO2  100%   Physical Exam Constitutional:      General: She is not in acute distress.    Appearance: Normal appearance. She is well-developed. She is not ill-appearing, toxic-appearing or diaphoretic.  HENT:     Head: Normocephalic and atraumatic.     Right Ear: Tympanic membrane and ear canal normal. No drainage or tenderness. No middle ear effusion. Tympanic membrane is not erythematous.     Left Ear: Tympanic membrane and ear canal normal. No drainage or tenderness.  No middle ear effusion. Tympanic membrane is not erythematous.     Nose: Nose normal. No congestion or rhinorrhea.     Mouth/Throat:     Mouth: Mucous membranes are moist. No oral lesions.     Pharynx: Oropharynx is clear. No pharyngeal swelling, oropharyngeal exudate, posterior oropharyngeal erythema or uvula swelling.     Tonsils: No tonsillar exudate or tonsillar abscesses.  Eyes:     Extraocular Movements: Extraocular movements intact.     Right eye: Normal extraocular motion.     Left eye: Normal extraocular motion.     Conjunctiva/sclera: Conjunctivae normal.     Pupils: Pupils are equal, round, and reactive to light.  Cardiovascular:     Rate and Rhythm: Normal rate and regular rhythm.     Pulses: Normal pulses.     Heart sounds: Normal heart sounds.  No murmur heard.  No friction rub. No gallop.   Pulmonary:     Effort: Pulmonary effort is normal. No respiratory distress.     Breath sounds: Normal breath sounds. No stridor. No wheezing, rhonchi or rales.  Musculoskeletal:     Cervical back: Normal range of motion and neck supple.  Lymphadenopathy:     Cervical: No cervical adenopathy.  Skin:    General: Skin is warm and dry.     Findings: No rash.  Neurological:     General: No focal deficit present.     Mental Status: She is alert and oriented to person, place, and time.     Cranial Nerves: No cranial nerve deficit.     Motor: No weakness.     Coordination: Coordination normal.     Gait: Gait normal.      Deep Tendon Reflexes: Reflexes normal.  Psychiatric:        Mood and Affect: Mood normal.        Behavior: Behavior normal.        Thought Content: Thought content normal.        Judgment: Judgment normal.     Results for orders placed or performed during the hospital encounter of 01/23/20 (from the past 24 hour(s))  POC Urinalysis dipstick     Status: Abnormal   Collection Time: 01/23/20  6:28 PM  Result Value Ref Range   Glucose, UA NEGATIVE NEGATIVE mg/dL   Bilirubin Urine NEGATIVE NEGATIVE   Ketones, ur NEGATIVE NEGATIVE mg/dL   Specific Gravity, Urine <=1.005 1.005 - 1.030   Hgb urine dipstick NEGATIVE NEGATIVE   pH 6.0 5.0 - 8.0   Protein, ur NEGATIVE NEGATIVE mg/dL   Urobilinogen, UA 0.2 0.0 - 1.0 mg/dL   Nitrite NEGATIVE NEGATIVE   Leukocytes,Ua SMALL (A) NEGATIVE  POC urine pregnancy     Status: None   Collection Time: 01/23/20  6:37 PM  Result Value Ref Range   Preg Test, Ur NEGATIVE NEGATIVE    Assessment and Plan :   PDMP not reviewed this encounter.  1. Vertigo   2. Dizziness     Physical exam findings, vital signs reassuring.  Reviewed recent lab results with patient.  Recommended meclizine for supportive care of her vertigo/dizziness.  Emphasized need for follow-up with her PCP for complete lab panel. Counseled patient on potential for adverse effects with medications prescribed/recommended today, ER and return-to-clinic precautions discussed, patient verbalized understanding.    Wallis Bamberg, PA-C 01/24/20 1005

## 2020-01-28 ENCOUNTER — Other Ambulatory Visit: Payer: Self-pay | Admitting: Internal Medicine

## 2020-01-28 DIAGNOSIS — E049 Nontoxic goiter, unspecified: Secondary | ICD-10-CM

## 2020-01-31 ENCOUNTER — Emergency Department (HOSPITAL_COMMUNITY): Payer: 59

## 2020-01-31 ENCOUNTER — Other Ambulatory Visit: Payer: Self-pay

## 2020-01-31 ENCOUNTER — Emergency Department (HOSPITAL_COMMUNITY)
Admission: EM | Admit: 2020-01-31 | Discharge: 2020-01-31 | Disposition: A | Discharge status: Home / Self Care | Payer: 59 | Attending: Emergency Medicine | Admitting: Emergency Medicine

## 2020-01-31 DIAGNOSIS — D649 Anemia, unspecified: Secondary | ICD-10-CM

## 2020-01-31 DIAGNOSIS — Z20822 Contact with and (suspected) exposure to covid-19: Secondary | ICD-10-CM | POA: Insufficient documentation

## 2020-01-31 DIAGNOSIS — J45909 Unspecified asthma, uncomplicated: Secondary | ICD-10-CM | POA: Diagnosis not present

## 2020-01-31 DIAGNOSIS — R0602 Shortness of breath: Secondary | ICD-10-CM | POA: Diagnosis present

## 2020-01-31 LAB — BASIC METABOLIC PANEL
Anion gap: 12 (ref 5–15)
BUN: 8 mg/dL (ref 6–20)
CO2: 23 mmol/L (ref 22–32)
Calcium: 9.5 mg/dL (ref 8.9–10.3)
Chloride: 106 mmol/L (ref 98–111)
Creatinine, Ser: 0.76 mg/dL (ref 0.44–1.00)
GFR calc Af Amer: >60 mL/min (ref >60)
GFR calc non Af Amer: >60 mL/min (ref >60)
Glucose, Bld: 88 mg/dL (ref 70–99)
Potassium: 4.1 mmol/L (ref 3.5–5.1)
Sodium: 141 mmol/L (ref 135–145)

## 2020-01-31 LAB — SARS CORONAVIRUS 2 BY RT PCR (HOSPITAL ORDER, PERFORMED IN ~~LOC~~ HOSPITAL LAB): SARS Coronavirus 2: NEGATIVE

## 2020-01-31 LAB — CBC
HCT: 38.3 % (ref 36.0–46.0)
Hemoglobin: 11.6 g/dL — ABNORMAL LOW (ref 12.0–15.0)
MCH: 25.1 pg — ABNORMAL LOW (ref 26.0–34.0)
MCHC: 30.3 g/dL (ref 30.0–36.0)
MCV: 82.7 fL (ref 80.0–100.0)
Platelets: 315 10*3/uL (ref 150–400)
RBC: 4.63 MIL/uL (ref 3.87–5.11)
RDW: 14.3 % (ref 11.5–15.5)
WBC: 4.4 10*3/uL (ref 4.0–10.5)
nRBC: 0 % (ref 0.0–0.2)

## 2020-01-31 LAB — I-STAT BETA HCG BLOOD, ED (NOT ORDERABLE): I-stat hCG, quantitative: <5 m[IU]/mL (ref <5)

## 2020-01-31 LAB — D-DIMER, QUANTITATIVE: D-Dimer, Quant: 0.32 ug/mL-FEU (ref 0.00–0.50)

## 2020-01-31 LAB — TROPONIN I (HIGH SENSITIVITY): Troponin I (High Sensitivity): <2 ng/L (ref <18)

## 2020-01-31 NOTE — ED Triage Notes (Signed)
Patient reports she had abortion around one month ago with the pill. Did not follow up with clinic and states for past two weeks she has had chest pains, shortness of breath. Denies chest pain right now. No pain in triage but endorses shobr.

## 2020-01-31 NOTE — Discharge Instructions (Addendum)
Your work-up today was overall reassuring.  Your blood work did show some mild anemia.  Please take some over-the-counter iron supplements.  Please make sure to follow-up with your primary care doctor if your symptoms not improve.  Return to the ER if your symptoms worsen.  You are also swabbed for Covid today, you may check your results via MyChart.  Please quarantine until then and quarantine an additional 7 to 10 days if your symptoms do not improve.  Please make sure to continue drinking lots of fluids and eating a healthy diet.

## 2020-01-31 NOTE — ED Provider Notes (Addendum)
[facility]-EMERGENCY DEPT Provider Note   CSN: [id number] Arrival date & time: 01/31/20  1226     History Chief Complaint  Patient presents with  . Shortness of Breath    x2 weeks  . Dizziness    Kara Collins is a 24 y.o. female.  HPI 24 year old female with a history of asthma and a recent abortion approximately a month ago presents to the ER with 2 years of intermittent dizziness and shortness of breath.  Per patient and chart review, she has been seen multiple times at urgent care for this.  No acute abnormalities were noted, she was diagnosed with vertigo approximately a month ago and was told to take meclizine.  Patient states that she has been taking meclizine which mildly improved her symptoms.  She endorses some shortness of breath with exertion, and dizziness sometimes even at rest.  Patient states that her symptoms seem to have worsened after having a pill abortion approximately a month ago.  Reports episodes of tachycardia and shortness of breath at night, and thinks that these are panic attacks.  Denies any nausea, vomiting, lip swelling, chest pain, dysuria.  Not vaccinated for Covid.  Denies any cough or infectious symptoms.  She denies any excessive bleeding since her abortion.    Past Medical History:  Diagnosis Date  . Abortion   . Asthma     There are no problems to display for this patient.   Past Surgical History:  Procedure Laterality Date  . CESAREAN SECTION    . DILATION AND CURETTAGE OF UTERUS       OB History   No obstetric history on file.     Family History  Problem Relation Age of Onset  . Other Mother   . Healthy Father     Social History   Tobacco Use  . Smoking status: Never Smoker  . Smokeless tobacco: Never Used  Vaping Use  . Vaping Use: Never used  Substance Use Topics  . Alcohol use: Never  . Drug use: Never    Home Medications Prior to Admission medications   Medication Sig Start Date End Date  Taking? Authorizing Provider  Acetaminophen 325 MG CAPS Take by mouth.    [provider]  albuterol (VENTOLIN HFA) 108 (90 Base) MCG/ACT inhaler Inhale 2 puffs into the lungs every 4 (four) hours as needed for wheezing or shortness of breath. 09/27/19   Moshe Cipro, NP  ibuprofen (ADVIL) 600 MG tablet Take 1 tablet (600 mg total) by mouth every 6 (six) hours as needed. 07/18/19   Domenick Gong, MD  meclizine (ANTIVERT) 12.5 MG tablet Take 1 tablet (12.5 mg total) by mouth 3 (three) times daily as needed for dizziness. 01/23/20   Wallis Bamberg, PA-C    Allergies    Cephalexin, Pollen extract, Sulfa antibiotics, and Zofran [ondansetron hcl]  Review of Systems   Review of Systems  Constitutional: Negative for chills and fever.  HENT: Negative for ear pain and sore throat.   Eyes: Negative for pain and visual disturbance.  Respiratory: Positive for shortness of breath. Negative for cough.   Cardiovascular: Negative for chest pain and palpitations.  Gastrointestinal: Negative for abdominal pain and vomiting.  Genitourinary: Negative for dysuria and hematuria.  Musculoskeletal: Negative for arthralgias and back pain.  Skin: Negative for color change and rash.  Neurological: Positive for dizziness and headaches. Negative for seizures and syncope.  Psychiatric/Behavioral: The patient is nervous/anxious.   All other systems reviewed and are negative.  Physical Exam Updated Vital Signs BP 116/67 (BP Location: Left Arm)   Pulse 79   Temp 99.4 F (37.4 C) (Oral)   Resp 16   Ht 5\' 2"  (1.575 m)   Wt 103.4 kg   LMP 01/11/2020   SpO2 100%   BMI 41.70 kg/m   Physical Exam Vitals and nursing note reviewed.  Constitutional:      General: She is not in acute distress.    Appearance: Normal appearance. She is well-developed. She is obese. She is not ill-appearing, toxic-appearing or diaphoretic.  HENT:     Head: Normocephalic and atraumatic.     Comments: Horizontal  nystagmus bilaterally on exam Eyes:     General:        Right eye: No discharge.        Left eye: No discharge.     Extraocular Movements: Extraocular movements intact.     Conjunctiva/sclera: Conjunctivae normal.  Cardiovascular:     Rate and Rhythm: Normal rate and regular rhythm.     Heart sounds: No murmur heard.   Pulmonary:     Effort: Pulmonary effort is normal. No respiratory distress.     Breath sounds: Normal breath sounds. No decreased breath sounds, wheezing, rhonchi or rales.  Chest:     Chest wall: No tenderness.  Abdominal:     Palpations: Abdomen is soft.     Tenderness: There is no abdominal tenderness.  Musculoskeletal:        General: No swelling. Normal range of motion.     Cervical back: Normal range of motion and neck supple.     Right lower leg: No tenderness. No edema.     Left lower leg: No tenderness. No edema.  Skin:    General: Skin is warm and dry.     Findings: No erythema or rash.  Neurological:     General: No focal deficit present.     Mental Status: She is alert and oriented to person, place, and time.     Cranial Nerves: No cranial nerve deficit.     Motor: No weakness.  Psychiatric:        Mood and Affect: Mood normal.        Behavior: Behavior normal.     ED Results / Procedures / Treatments   Labs (all labs ordered are listed, but only abnormal results are displayed) Labs Reviewed  CBC - Abnormal; Notable for the following components:      Result Value   Hemoglobin 11.6 (*)    MCH 25.1 (*)    All other components within normal limits  SARS CORONAVIRUS 2 BY RT PCR (HOSPITAL ORDER, PERFORMED IN [facility])  BASIC METABOLIC PANEL  D-DIMER, QUANTITATIVE (NOT AT [facility])  I-STAT BETA HCG BLOOD, ED ([facility], [facility], [facility] ONLY)  I-STAT BETA HCG BLOOD, ED (NOT ORDERABLE)  TROPONIN I (HIGH SENSITIVITY)  TROPONIN I (HIGH SENSITIVITY)    EKG EKG Interpretation  Date/Time:  Sunday January 31 2020 12:48:08 EDT Ventricular Rate:   77 PR Interval:    QRS Duration: 95 QT Interval:  369 QTC Calculation: 418 R Axis:   12 Text Interpretation: Sinus rhythm RSR' in V1 or V2, right VCD or RVH 12 Lead; Mason-Likar No significant change since last tracing Confirmed by 12-04-2005 [phone]) on 01/31/2020 5:25:47 PM   Radiology DG Chest 2 View  Result Date: 01/31/2020 CLINICAL DATA:  Chest pain EXAM: CHEST - 2 VIEW COMPARISON:  None. FINDINGS: The heart size and mediastinal contours  are within normal limits. Both lungs are clear. The visualized skeletal structures are unremarkable. IMPRESSION: No active cardiopulmonary disease. Electronically Signed   By: Gerome Sam III M.D   On: 01/31/2020 12:52    Procedures Procedures (including critical care time)  Medications Ordered in ED Medications - No data to display  ED Course  I have reviewed the triage vital signs and the nursing notes.  Pertinent labs & imaging results that were available during my care of the patient were reviewed by me and considered in my medical decision making (see chart for details).    MDM Rules/Calculators/A&P                         24 year old well-appearing female presents with 2 years of intermittent dizziness and shortness of breath, worsening over the last month since having an abortion On presentation, she is alert and oriented, nontoxic-appearing, no acute distress, with no increased work of breathing.  Vitals overall reassuring, she is not hypotensive, hypoxic or tachycardic.  Physical exam with clear lung sounds, she does have some horizontal nystagmus bilaterally on exam.  No gross neuro deficits on exam.  Lab work with no leukocytosis, hemoglobin of 11.6.  BMP without any electrolyte abnormalities, D-dimer negative.  Initial troponin less than 2, patient has not had any chest pain in over 4 hours.  Chest x-ray without evidence of pneumonia or pneumothorax.  EKG normal sinus rhythm.  I discussed the findings with the patient is overall  reassured.  Suspect that there is an element of vertigo to her dizziness, however she also has some mild anemia which may be contributing to her symptoms.  Patient has been having the symptoms ongoing for 2 years, do not think that she needs any CT imaging at this time.  I encouraged the patient to take supplemental iron and if her symptoms not improve, to follow-up with her PCP for further evaluation.  Strict return precautions discussed.  Patient is not vaccinated, will swab for Covid testing case.  Patient instructed to follow-up on her test results via MyChart and to quarantine until she receives her results.  She voices understanding and is agreeable.  At this stage in the ED course, the patient has been medically screened and stable for discharge.  Addendum: At discharge, I noted that the patient has a scheduled thyroid ultrasound with [facility] imaging center on August 25.  He mentioned this to the patient who was not aware of this appointment.  I encouraged her to keep this appointment.  She states she saw PCP who thought her thyroid was slightly enlarged.  TSH from 2019 was 2.83.  She does not show any evidence of tachycardia, confusion, or any other signs of a thyroid emergency.  I will defer TSH to her PCP and encouraged the patient to have this drawn.  She voices understanding and is agreeable.  Final Clinical Impression(s) / ED Diagnoses Final diagnoses:  Anemia, unspecified type    Rx / DC Orders ED Discharge Orders    None           Leone Brand 01/31/20 1813    Linwood Dibbles, MD 02/02/20 1246

## 2020-02-03 ENCOUNTER — Other Ambulatory Visit: Payer: 59

## 2020-02-05 ENCOUNTER — Ambulatory Visit
Admission: RE | Admit: 2020-02-05 | Discharge: 2020-02-05 | Disposition: A | Discharge status: Home / Self Care | Payer: 59 | Source: Ambulatory Visit | Attending: Internal Medicine | Admitting: Internal Medicine

## 2020-02-05 DIAGNOSIS — E049 Nontoxic goiter, unspecified: Secondary | ICD-10-CM

## 2020-02-10 ENCOUNTER — Other Ambulatory Visit: Payer: 59

## 2020-03-09 ENCOUNTER — Other Ambulatory Visit: Payer: Self-pay

## 2020-03-09 ENCOUNTER — Emergency Department (HOSPITAL_COMMUNITY)
Admission: EM | Admit: 2020-03-09 | Discharge: 2020-03-09 | Disposition: A | Discharge status: Home / Self Care | Payer: 59 | Attending: Emergency Medicine | Admitting: Emergency Medicine

## 2020-03-09 ENCOUNTER — Encounter (HOSPITAL_COMMUNITY): Payer: Self-pay | Admitting: *Deleted

## 2020-03-09 DIAGNOSIS — Z5321 Procedure and treatment not carried out due to patient leaving prior to being seen by health care provider: Secondary | ICD-10-CM | POA: Diagnosis not present

## 2020-03-09 DIAGNOSIS — R1030 Lower abdominal pain, unspecified: Secondary | ICD-10-CM | POA: Insufficient documentation

## 2020-03-09 NOTE — ED Triage Notes (Signed)
Lower abd/pelvic pain. Started last night

## 2020-03-23 DIAGNOSIS — R42 Dizziness and giddiness: Secondary | ICD-10-CM | POA: Insufficient documentation

## 2020-06-03 ENCOUNTER — Emergency Department (HOSPITAL_COMMUNITY)
Admission: EM | Admit: 2020-06-03 | Discharge: 2020-06-03 | Disposition: A | Discharge status: Home / Self Care | Payer: Self-pay | Attending: Emergency Medicine | Admitting: Emergency Medicine

## 2020-06-03 ENCOUNTER — Other Ambulatory Visit: Payer: Self-pay

## 2020-06-03 ENCOUNTER — Encounter (HOSPITAL_COMMUNITY): Payer: Self-pay | Admitting: Obstetrics and Gynecology

## 2020-06-03 ENCOUNTER — Emergency Department (HOSPITAL_COMMUNITY): Payer: Self-pay

## 2020-06-03 DIAGNOSIS — O208 Other hemorrhage in early pregnancy: Secondary | ICD-10-CM | POA: Insufficient documentation

## 2020-06-03 DIAGNOSIS — W010XXA Fall on same level from slipping, tripping and stumbling without subsequent striking against object, initial encounter: Secondary | ICD-10-CM | POA: Insufficient documentation

## 2020-06-03 DIAGNOSIS — S060X0A Concussion without loss of consciousness, initial encounter: Secondary | ICD-10-CM | POA: Insufficient documentation

## 2020-06-03 DIAGNOSIS — O9A211 Injury, poisoning and certain other consequences of external causes complicating pregnancy, first trimester: Secondary | ICD-10-CM | POA: Insufficient documentation

## 2020-06-03 DIAGNOSIS — Z3A01 Less than 8 weeks gestation of pregnancy: Secondary | ICD-10-CM | POA: Insufficient documentation

## 2020-06-03 DIAGNOSIS — O209 Hemorrhage in early pregnancy, unspecified: Secondary | ICD-10-CM

## 2020-06-03 DIAGNOSIS — W19XXXA Unspecified fall, initial encounter: Secondary | ICD-10-CM

## 2020-06-03 DIAGNOSIS — J45909 Unspecified asthma, uncomplicated: Secondary | ICD-10-CM | POA: Insufficient documentation

## 2020-06-03 LAB — CBC WITH DIFFERENTIAL/PLATELET
Abs Immature Granulocytes: 0.01 10*3/uL (ref 0.00–0.07)
Basophils Absolute: 0 10*3/uL (ref 0.0–0.1)
Basophils Relative: 0 %
Eosinophils Absolute: 0.1 10*3/uL (ref 0.0–0.5)
Eosinophils Relative: 2 %
HCT: 35.3 % — ABNORMAL LOW (ref 36.0–46.0)
Hemoglobin: 10.8 g/dL — ABNORMAL LOW (ref 12.0–15.0)
Immature Granulocytes: 0 %
Lymphocytes Relative: 29 %
Lymphs Abs: 1.8 10*3/uL (ref 0.7–4.0)
MCH: 25.4 pg — ABNORMAL LOW (ref 26.0–34.0)
MCHC: 30.6 g/dL (ref 30.0–36.0)
MCV: 82.9 fL (ref 80.0–100.0)
Monocytes Absolute: 0.5 10*3/uL (ref 0.1–1.0)
Monocytes Relative: 8 %
Neutro Abs: 3.7 10*3/uL (ref 1.7–7.7)
Neutrophils Relative %: 61 %
Platelets: 307 10*3/uL (ref 150–400)
RBC: 4.26 MIL/uL (ref 3.87–5.11)
RDW: 14.3 % (ref 11.5–15.5)
WBC: 6.1 10*3/uL (ref 4.0–10.5)
nRBC: 0 % (ref 0.0–0.2)

## 2020-06-03 LAB — COMPREHENSIVE METABOLIC PANEL
ALT: 17 U/L (ref 0–44)
AST: 21 U/L (ref 15–41)
Albumin: 4.2 g/dL (ref 3.5–5.0)
Alkaline Phosphatase: 51 U/L (ref 38–126)
Anion gap: 10 (ref 5–15)
BUN: 11 mg/dL (ref 6–20)
CO2: 22 mmol/L (ref 22–32)
Calcium: 9 mg/dL (ref 8.9–10.3)
Chloride: 105 mmol/L (ref 98–111)
Creatinine, Ser: 0.76 mg/dL (ref 0.44–1.00)
GFR, Estimated: >60 mL/min (ref >60)
Glucose, Bld: 94 mg/dL (ref 70–99)
Potassium: 4.3 mmol/L (ref 3.5–5.1)
Sodium: 137 mmol/L (ref 135–145)
Total Bilirubin: 0.1 mg/dL — ABNORMAL LOW (ref 0.3–1.2)
Total Protein: 7.9 g/dL (ref 6.5–8.1)

## 2020-06-03 LAB — URINALYSIS, ROUTINE W REFLEX MICROSCOPIC
Bilirubin Urine: NEGATIVE
Glucose, UA: NEGATIVE mg/dL
Ketones, ur: NEGATIVE mg/dL
Nitrite: NEGATIVE
Protein, ur: NEGATIVE mg/dL
Specific Gravity, Urine: 1.009 (ref 1.005–1.030)
pH: 6 (ref 5.0–8.0)

## 2020-06-03 LAB — PREGNANCY, URINE: Preg Test, Ur: POSITIVE — AB

## 2020-06-03 LAB — HCG, QUANTITATIVE, PREGNANCY: hCG, Beta Chain, Quant, S: 804 m[IU]/mL — ABNORMAL HIGH (ref <5)

## 2020-06-03 MED ORDER — SODIUM CHLORIDE 0.9 % IV BOLUS
1000.0000 mL | Freq: Once | INTRAVENOUS | Status: AC
  Administered 2020-06-03: 1000 mL via INTRAVENOUS

## 2020-06-03 MED ORDER — ACETAMINOPHEN 325 MG PO TABS
650.0000 mg | ORAL_TABLET | Freq: Once | ORAL | Status: AC
  Administered 2020-06-03: 650 mg via ORAL
  Filled 2020-06-03: qty 2

## 2020-06-03 NOTE — Discharge Instructions (Addendum)
Your ultrasound shows you have a single intrauterine pregnancy however you are only 4 weeks and 2 days pregnant so they do not see a fetus yet.  There is a hemorrhage underneath the placenta which does make you higher risk for having a miscarriage.  You may have more spotting.  Please follow-up with your OB doctor this coming week.  Return to the emergency room if you get worsening pain or bleeding like a regular period or start passing clots. Start taking folic acid prenatal vitamins.   Call your OB doctor at [facility] to get followed-up soon, they will want to recheck your pregnancy hormone levels.   Take tylenol if you have headaches   Return to ER if you have worse headaches, severe cramps, vomiting.

## 2020-06-03 NOTE — ED Provider Notes (Signed)
[facility]-EMERGENCY DEPT Provider Note   CSN: [id number] Arrival date & time: 06/03/20  1809     History Chief Complaint  Patient presents with  . Fall    Pregnant    Kara Collins is a 24 y.o. female here presenting with fall.  Patient states that she tripped and fell and hit her head earlier today.  She states that she also began spotting as well.  She states that she had a home pregnancy test that was positive but her last menstrual period was exactly a month ago.  Patient had 6 pregnancies before. Patient's blood type is O positive.   The history is provided by the patient.       Past Medical History:  Diagnosis Date  . Abortion   . Asthma     There are no problems to display for this patient.   Past Surgical History:  Procedure Laterality Date  . CESAREAN SECTION    . DILATION AND CURETTAGE OF UTERUS       OB History    Gravida  1   Para      Term      Preterm      AB      Living        SAB      IAB      Ectopic      Multiple      Live Births              Family History  Problem Relation Age of Onset  . Other Mother   . Healthy Father     Social History   Tobacco Use  . Smoking status: Never Smoker  . Smokeless tobacco: Never Used  Vaping Use  . Vaping Use: Never used  Substance Use Topics  . Alcohol use: Never  . Drug use: Never    Home Medications Prior to Admission medications   Medication Sig Start Date End Date Taking? Authorizing Provider  Acetaminophen 325 MG CAPS Take by mouth.    [provider]  albuterol (VENTOLIN HFA) 108 (90 Base) MCG/ACT inhaler Inhale 2 puffs into the lungs every 4 (four) hours as needed for wheezing or shortness of breath. 09/27/19   Moshe Cipro, NP  ibuprofen (ADVIL) 600 MG tablet Take 1 tablet (600 mg total) by mouth every 6 (six) hours as needed. 07/18/19   Domenick Gong, MD  meclizine (ANTIVERT) 12.5 MG tablet Take 1 tablet (12.5 mg total) by  mouth 3 (three) times daily as needed for dizziness. 01/23/20   Wallis Bamberg, PA-C    Allergies    Cephalexin, Pollen extract, Sulfa antibiotics, and Zofran [ondansetron hcl]  Review of Systems   Review of Systems  Neurological: Positive for headaches.  All other systems reviewed and are negative.   Physical Exam Updated Vital Signs BP 117/67 (BP Location: Right Arm)   Pulse 76   Temp 98.7 F (37.1 C) (Oral)   Resp 18   Ht 5\' 2"  (1.575 m)   Wt 102.5 kg   LMP 05/04/2020   SpO2 100%   BMI 41.34 kg/m   Physical Exam Vitals and nursing note reviewed.  Constitutional:      Appearance: Normal appearance.     Comments: Slightly uncomfortable   HENT:     Head: Normocephalic and atraumatic.     Comments: No obvious scalp hematoma     Mouth/Throat:     Mouth: Mucous membranes are moist.  Eyes:  Extraocular Movements: Extraocular movements intact.     Pupils: Pupils are equal, round, and reactive to light.  Neck:     Comments: No midline tenderness  Cardiovascular:     Rate and Rhythm: Normal rate and regular rhythm.     Pulses: Normal pulses.     Heart sounds: Normal heart sounds.  Pulmonary:     Effort: Pulmonary effort is normal.     Breath sounds: Normal breath sounds.  Abdominal:     General: Abdomen is flat.     Palpations: Abdomen is soft.     Comments: No obvious lower abdominal tenderness   Musculoskeletal:        General: Normal range of motion.     Cervical back: Normal range of motion and neck supple.     Comments: No midline spinal tenderness, no extremity trauma   Neurological:     General: No focal deficit present.     Mental Status: She is alert and oriented to person, place, and time.  Psychiatric:        Mood and Affect: Mood normal.        Behavior: Behavior normal.     ED Results / Procedures / Treatments   Labs (all labs ordered are listed, but only abnormal results are displayed) Labs Reviewed  PREGNANCY, URINE - Abnormal; Notable for  the following components:      Result Value   Preg Test, Ur POSITIVE (*)    All other components within normal limits  URINALYSIS, ROUTINE W REFLEX MICROSCOPIC - Abnormal; Notable for the following components:   APPearance CLOUDY (*)    Hgb urine dipstick MODERATE (*)    Leukocytes,Ua LARGE (*)    Bacteria, UA RARE (*)    All other components within normal limits  CBC WITH DIFFERENTIAL/PLATELET - Abnormal; Notable for the following components:   Hemoglobin 10.8 (*)    HCT 35.3 (*)    MCH 25.4 (*)    All other components within normal limits  COMPREHENSIVE METABOLIC PANEL - Abnormal; Notable for the following components:   Total Bilirubin 0.1 (*)    All other components within normal limits  HCG, QUANTITATIVE, PREGNANCY - Abnormal; Notable for the following components:   hCG, Beta Chain, Quant, S 804 (*)    All other components within normal limits  URINE CULTURE    EKG None  Radiology No results found.  Procedures Procedures (including critical care time)  Medications Ordered in ED Medications  acetaminophen (TYLENOL) tablet 650 mg (650 mg Oral Given 06/03/20 1848)  sodium chloride 0.9 % bolus 1,000 mL (0 mLs Intravenous Stopped 06/03/20 2244)    ED Course  I have reviewed the triage vital signs and the nursing notes.  Pertinent labs & imaging results that were available during my care of the patient were reviewed by me and considered in my medical decision making (see chart for details).    MDM Rules/Calculators/A&P                         Kara Collins is a 24 y.o. female presenting with fall.  Patient fell and hit her head.  Patient states that she may be pregnant her last menstrual period is exactly a month ago.  Will get pregnancy test and give some Tylenol.  Patient had a minor head injury and has no vomiting so we will hold off on CT head right now  11 PM Patient's labs showed hCG of 800. UA  showed blood and rare bacteria but she has no dysuria and appears  contaminated specimen so will send culture. She is also allergic to keflex so will not give empiric abx. Tansvag [place] pending. I see early IUP. Signed out to Dr. Lynelle Doctor in the ED. If US showed early IUP, anticipate dc home. Signed out to Dr. Lynelle Doctor in the ED.    Final Clinical Impression(s) / ED Diagnoses Final diagnoses:  First trimester bleeding  Concussion without loss of consciousness, initial encounter    Rx / DC Orders ED Discharge Orders    None       Charlynne Pander, MD 06/03/20 2258

## 2020-06-03 NOTE — ED Triage Notes (Signed)
Patient reports to the ER for a fall. Patient reports she is pregnant, unsure how many weeks and has not yet established OB care. Patient reports sharp pains in her abdomen and that she hit her head when she fell. Patient reports feeling "out of it" after she fell.

## 2020-06-03 NOTE — ED Provider Notes (Signed)
Patient left at change of shift to get results of her pelvic ultrasound.  Patient states she gets her OB care at [facility] in [place].  We discussed her ultrasound report including being subchorionic hemorrhage.  She understands she needs to follow-up and get serial beta hCGs done.  She should return for increasing pain, heavier bleeding, passing clots or for any problems listed on the head injury sheet.  She also knows she needs to start taking folic acid.  US OB Transvaginal  Result Date: 06/03/2020 CLINICAL DATA:  Status post fall with abdominal pain. EXAM: OBSTETRIC <14 WK [place] AND TRANSVAGINAL OB US TECHNIQUE: Both transabdominal and transvaginal ultrasound examinations were performed for complete evaluation of the gestation as well as the maternal uterus, adnexal regions, and pelvic cul-de-sac. Transvaginal technique was performed to assess early pregnancy. COMPARISON:  None. FINDINGS: Intrauterine gestational sac: Single Yolk sac:  Not Visualized. Embryo:  Not Visualized. Cardiac Activity: Not Visualized. Heart Rate: N/A  bpm MSD: 2 mm   4 w   6 d Subchorionic hemorrhage:  Moderate sized. Maternal uterus/adnexae: A 2.9 cm x 2.3 cm x 3.5 cm heterogeneous right uterine fundal fibroid is suspected. A 3.5 cm x 3.5 cm x 3.1 cm anechoic, cystic appearing structure is seen within the right ovary. An additional 2.1 cm x 2.0 cm x 1.7 cm heterogeneous right ovarian structure is noted. A 1.2 cm x 1.0 cm x 0.9 cm that para ovarian cyst is seen adjacent to the left ovary. A moderate amount of pelvic free fluid is noted. IMPRESSION: 1. A single intrauterine gestational sac, at approximately 4 weeks and 2 days gestation by ultrasound evaluation, without visualization of a yolk sac or fetal pole. While this may be secondary to early intrauterine pregnancy, correlation with follow-up pelvic ultrasound and serial beta HCG levels is recommended. 2. Large right ovarian simple cyst with additional findings which may represent  an adjacent right ovarian corpus luteum cyst. Follow-up pelvic ultrasound is again recommended. 3. Left-sided para ovarian cyst. 4. Moderate sized subchorionic hemorrhage. 5. Right uterine fundal fibroid. Electronically Signed   By: Aram Candela M.D.   On: 06/03/2020 23:09   Diagnoses that have been ruled out:  None  Diagnoses that are still under consideration:  None  Final diagnoses:  First trimester bleeding  Concussion without loss of consciousness, initial encounter  Fall, initial encounter   Plan discharge  Devoria Albe, MD, Concha Pyo, MD 06/03/20 2326

## 2020-06-04 LAB — URINE CULTURE: Culture: 10000 — AB

## 2020-06-05 ENCOUNTER — Telehealth (HOSPITAL_BASED_OUTPATIENT_CLINIC_OR_DEPARTMENT_OTHER): Payer: Self-pay | Admitting: Emergency Medicine

## 2020-06-05 NOTE — Progress Notes (Signed)
ED Antimicrobial Stewardship Positive Culture Follow Up   Kara Collins is an 24 y.o. female who presented to [facility] on 06/03/2020 with a chief complaint of  Chief Complaint  Patient presents with  . Fall    Pregnant    Recent Results (from the past 720 hour(s))  Urine Culture     Status: Abnormal   Collection Time: 06/03/20 10:42 PM   Specimen: Urine, Clean Catch  Result Value Ref Range Status   Specimen Description   Final    URINE, CLEAN CATCH Performed at [facility], [address], [place], [place] [address]    Special Requests   Final    NONE Performed at [facility], [address], [place], [place] [address]    Culture (A)  Final    10,000 COLONIES/mL GROUP B STREP(S.AGALACTIAE)ISOLATED TESTING AGAINST S. AGALACTIAE NOT ROUTINELY PERFORMED DUE TO PREDICTABILITY OF AMP/PEN/VAN SUSCEPTIBILITY. Performed at [facility], [address] [place], [place] [address]    Report Status 06/04/2020 FINAL  Final    [x]  Patient discharged originally without antimicrobial agent and treatment is now indicated  New antibiotic prescription: Amoxicillin 500mg  orally every 8 hours x 7 days (#21 capsules)  ED Provider: , PA-C   06/05/2020, 3:22 PM Clinical Pharmacist [phone]

## 2020-06-05 NOTE — Telephone Encounter (Incomplete)
Post ED Visit - Positive Culture Follow-up: Unsuccessful Patient Follow-up  Culture assessed and recommendations reviewed by:  []  , Pharm.D. []  Enzo Bi, Pharm.D., BCPS AQ-ID []  , Pharm.D., BCPS []  Celedonio Miyamoto, Pharm.D., BCPS []  [place], Garvin Fila.D., BCPS, AAHIVP []  , Pharm.D., BCPS, AAHIVP []  Georgina Pillion, PharmD []  , PharmD, BCPS  Positive urine culture  [x]  Patient discharged without antimicrobial prescription and treatment is now indicated []  Organism is resistant to prescribed ED discharge antimicrobial []  Patient with positive blood cultures   Unable to contact patient at phone number on file, mailbox full, letter will be sent to address on file.  Plan: Amoxicillin 500 mg PO every 8 hours x seven days - [place]   [address] Kara Collins 06/05/2020, 5:17 PM

## 2020-06-13 ENCOUNTER — Emergency Department (HOSPITAL_COMMUNITY)
Admission: EM | Admit: 2020-06-13 | Discharge: 2020-06-13 | Disposition: A | Discharge status: Home / Self Care | Payer: Self-pay | Attending: Emergency Medicine | Admitting: Emergency Medicine

## 2020-06-13 ENCOUNTER — Encounter (HOSPITAL_COMMUNITY): Payer: Self-pay | Admitting: *Deleted

## 2020-06-13 ENCOUNTER — Other Ambulatory Visit: Payer: Self-pay

## 2020-06-13 DIAGNOSIS — Z5321 Procedure and treatment not carried out due to patient leaving prior to being seen by health care provider: Secondary | ICD-10-CM | POA: Insufficient documentation

## 2020-06-13 DIAGNOSIS — N939 Abnormal uterine and vaginal bleeding, unspecified: Secondary | ICD-10-CM | POA: Insufficient documentation

## 2020-06-13 NOTE — ED Triage Notes (Signed)
Pt complains of right pelvic pan radiating down right leg and vaginal bleeding. She had a recent abortion.

## 2020-06-13 NOTE — ED Notes (Signed)
Pt turned in her labels to screeners and walked out

## 2020-06-28 DIAGNOSIS — J4520 Mild intermittent asthma, uncomplicated: Secondary | ICD-10-CM | POA: Insufficient documentation

## 2020-06-28 DIAGNOSIS — K219 Gastro-esophageal reflux disease without esophagitis: Secondary | ICD-10-CM | POA: Insufficient documentation

## 2020-08-10 DIAGNOSIS — R109 Unspecified abdominal pain: Secondary | ICD-10-CM | POA: Insufficient documentation

## 2020-10-02 ENCOUNTER — Other Ambulatory Visit: Payer: Self-pay

## 2020-10-02 ENCOUNTER — Ambulatory Visit
Admission: EM | Admit: 2020-10-02 | Discharge: 2020-10-02 | Payer: Self-pay | Attending: Emergency Medicine | Admitting: Emergency Medicine

## 2020-10-17 DIAGNOSIS — R7303 Prediabetes: Secondary | ICD-10-CM | POA: Insufficient documentation

## 2020-10-27 DIAGNOSIS — N83209 Unspecified ovarian cyst, unspecified side: Secondary | ICD-10-CM | POA: Insufficient documentation

## 2021-01-09 DIAGNOSIS — T148XXA Other injury of unspecified body region, initial encounter: Secondary | ICD-10-CM

## 2021-01-09 DIAGNOSIS — S82899A Other fracture of unspecified lower leg, initial encounter for closed fracture: Secondary | ICD-10-CM

## 2021-01-09 HISTORY — DX: Other injury of unspecified body region, initial encounter: T14.8XXA

## 2021-01-09 HISTORY — DX: Other fracture of unspecified lower leg, initial encounter for closed fracture: S82.899A

## 2021-02-07 ENCOUNTER — Ambulatory Visit
Admission: EM | Admit: 2021-02-07 | Discharge: 2021-02-07 | Disposition: A | Discharge status: Home / Self Care | Payer: Commercial Managed Care - PPO

## 2021-02-07 ENCOUNTER — Other Ambulatory Visit: Payer: Self-pay

## 2021-02-07 DIAGNOSIS — S301XXA Contusion of abdominal wall, initial encounter: Secondary | ICD-10-CM

## 2021-02-07 DIAGNOSIS — R079 Chest pain, unspecified: Secondary | ICD-10-CM | POA: Diagnosis not present

## 2021-02-07 DIAGNOSIS — Z789 Other specified health status: Secondary | ICD-10-CM | POA: Diagnosis not present

## 2021-02-07 DIAGNOSIS — R0602 Shortness of breath: Secondary | ICD-10-CM | POA: Diagnosis not present

## 2021-02-07 HISTORY — DX: Anemia, unspecified: D64.9

## 2021-02-07 NOTE — Discharge Instructions (Addendum)
-   Your EKG looks good today. -I did review all the imaging that you had done at the ER.  It is all reassuring. -The way you describe your symptoms could be caused by an aspirin intolerance, possibly since you have a history of asthma.  When you take aspirin it can cause you to have bronchospasms and flare of your asthma which could be causing the chest tightness and shortness of breath.  Additionally any NSAID can give you stomach upset including nausea/vomiting or diarrhea.  I would advise you to either take it once a day instead of twice a day or to stop taking it and let the orthopedics know.  You can see if they can prescribe you something different.  Until then I would take Tylenol for discomfort.  You can try ibuprofen if it does not cause you any of the symptoms.  Use your asthma inhaler if needed for any shortness of breath.  If you experience any acute worsening of the chest pain or shortness of breath he need to call 911 or have someone take you to the emergency department. -The abdominal pain is due to hematoma.  As we discussed you should start applying warm compresses several times throughout the day to help break up the hematoma.  See handout.  If your abdominal pain worsens or you develop blood in the stool, black stools or blood in the urine you need to be seen again emergently. -Keep follow-ups with PCP and specialist.  Go to emergency department for any acute worsening of any your symptoms.
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
## 2021-02-13 DIAGNOSIS — D649 Anemia, unspecified: Secondary | ICD-10-CM | POA: Insufficient documentation

## 2021-02-13 DIAGNOSIS — S82892D Other fracture of left lower leg, subsequent encounter for closed fracture with routine healing: Secondary | ICD-10-CM | POA: Insufficient documentation

## 2021-02-19 ENCOUNTER — Other Ambulatory Visit: Payer: Self-pay

## 2021-02-19 DIAGNOSIS — M25572 Pain in left ankle and joints of left foot: Secondary | ICD-10-CM | POA: Insufficient documentation

## 2021-02-19 DIAGNOSIS — Z5321 Procedure and treatment not carried out due to patient leaving prior to being seen by health care provider: Secondary | ICD-10-CM | POA: Diagnosis not present

## 2021-02-19 DIAGNOSIS — R202 Paresthesia of skin: Secondary | ICD-10-CM | POA: Diagnosis not present

## 2021-02-19 DIAGNOSIS — M79662 Pain in left lower leg: Secondary | ICD-10-CM | POA: Diagnosis not present

## 2021-02-19 NOTE — ED Notes (Signed)
Spoke with dr. Cyril Loosen regarding pt's chief complaint and possible dvt ultrasound. Per md to wait for ultrasound order at this time until MD evaluation.

## 2021-02-19 NOTE — ED Triage Notes (Signed)
Pt with cast noted to left ankle and lower leg. Pt states she is having burning noted to left calf and ankle, pt states she does have some intermittent numbness and blue color to toes when in dependent position.
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
## 2021-02-20 ENCOUNTER — Emergency Department
Admission: EM | Admit: 2021-02-20 | Discharge: 2021-02-20 | Disposition: A | Discharge status: Home / Self Care | Payer: Commercial Managed Care - PPO | Attending: Emergency Medicine | Admitting: Emergency Medicine

## 2021-02-20 NOTE — ED Notes (Signed)
Pt to stat desk stating her ride is taking her elsewhere due to wait times

## 2021-04-04 DIAGNOSIS — Z8616 Personal history of COVID-19: Secondary | ICD-10-CM | POA: Insufficient documentation

## 2021-04-11 ENCOUNTER — Emergency Department
Admission: EM | Admit: 2021-04-11 | Discharge: 2021-04-11 | Disposition: A | Discharge status: Home / Self Care | Payer: Self-pay | Attending: Emergency Medicine | Admitting: Emergency Medicine

## 2021-04-11 ENCOUNTER — Emergency Department: Payer: Self-pay

## 2021-04-11 ENCOUNTER — Other Ambulatory Visit: Payer: Self-pay

## 2021-04-11 DIAGNOSIS — W19XXXA Unspecified fall, initial encounter: Secondary | ICD-10-CM | POA: Insufficient documentation

## 2021-04-11 DIAGNOSIS — M25572 Pain in left ankle and joints of left foot: Secondary | ICD-10-CM | POA: Insufficient documentation

## 2021-04-11 DIAGNOSIS — Z5321 Procedure and treatment not carried out due to patient leaving prior to being seen by health care provider: Secondary | ICD-10-CM | POA: Insufficient documentation
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
## 2021-04-11 NOTE — ED Notes (Signed)
No answer when called several times from lobby; no answer when phone # listed in chart called 

## 2021-04-11 NOTE — ED Notes (Signed)
No answer when called several times from lobby 
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
## 2021-10-13 IMAGING — CR DG CHEST 2V
2 series · 2 of 2 positions shown · non-contrast
Comparison: None.

CLINICAL DATA: Chest pain

EXAM:
CHEST - 2 VIEW

[w chest pa]
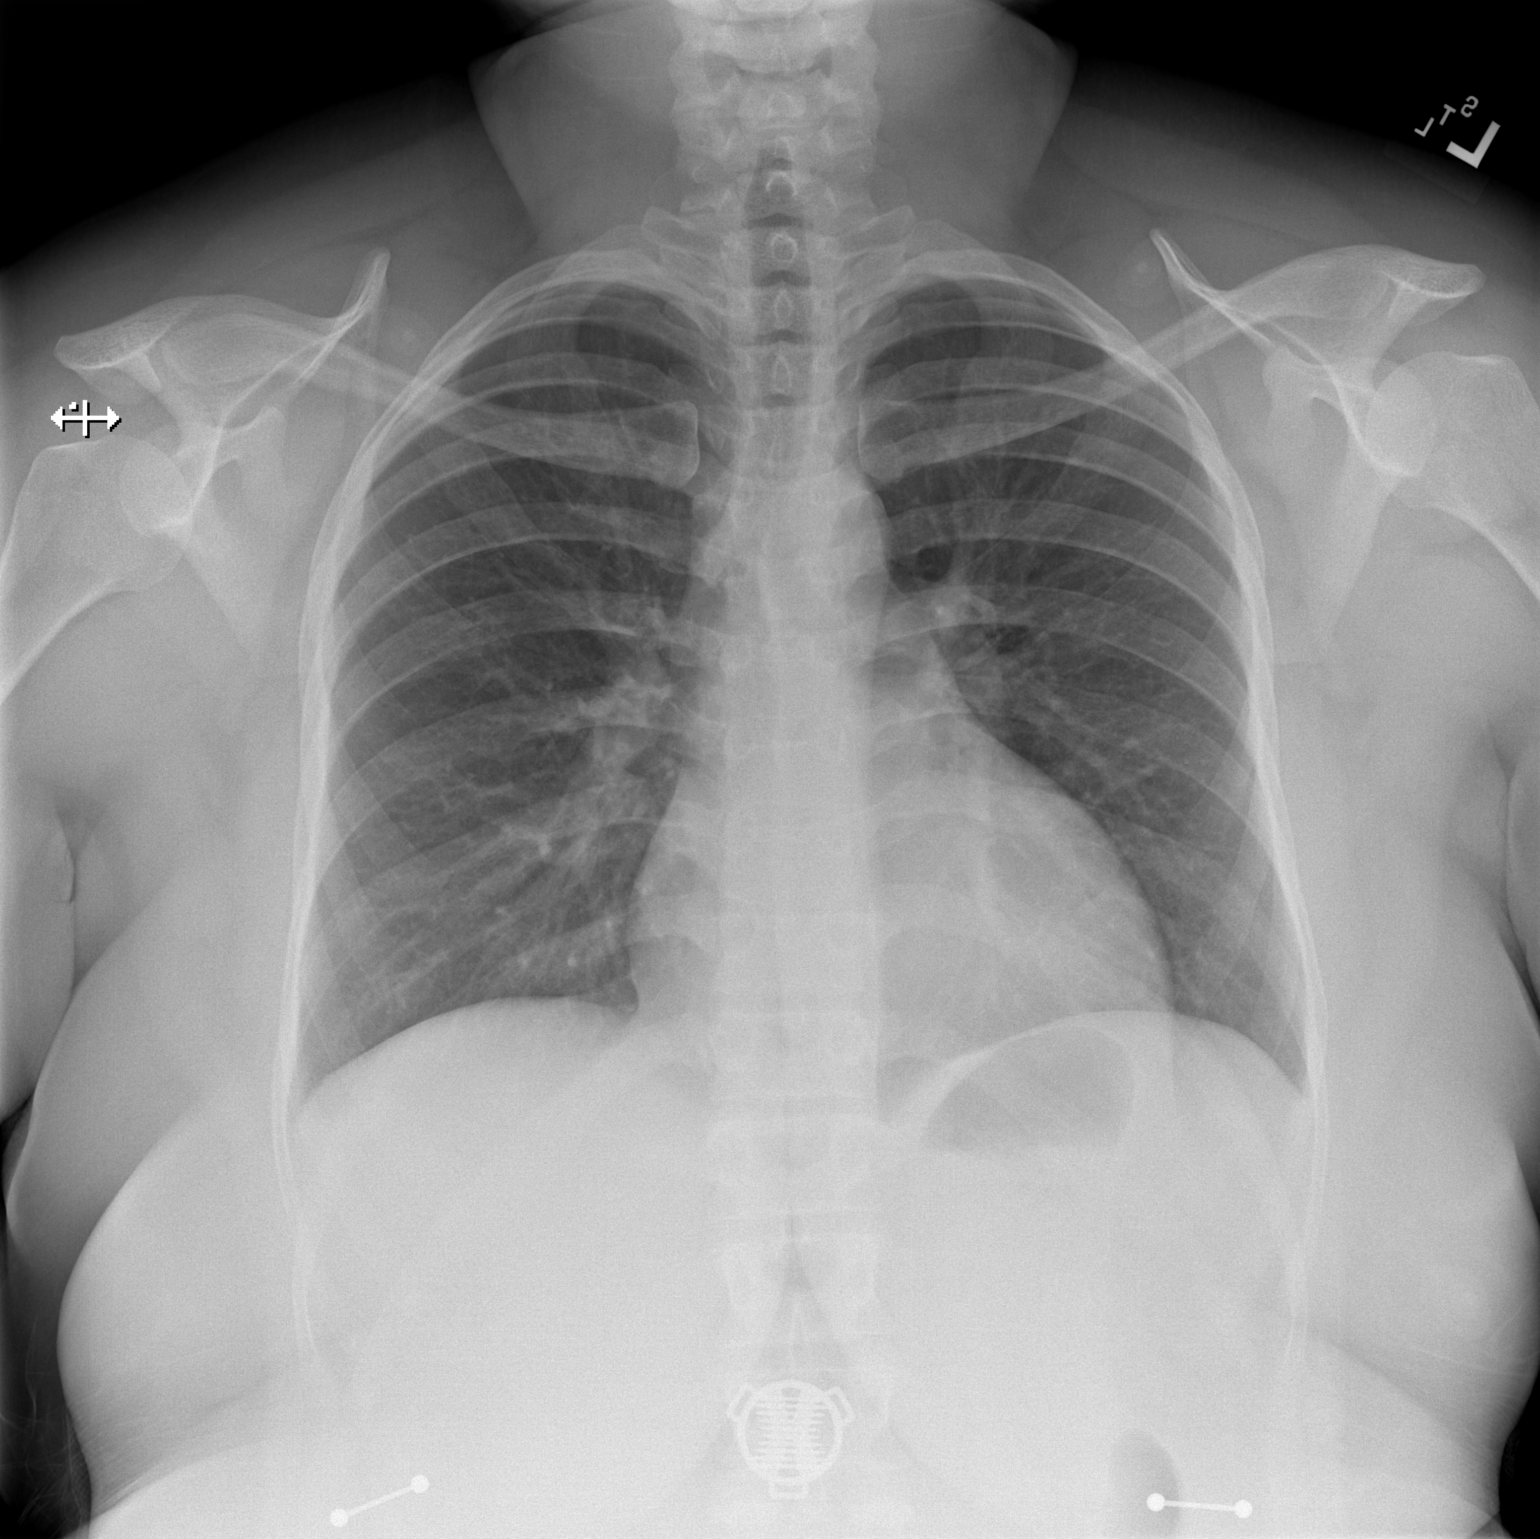

[w chest lat]
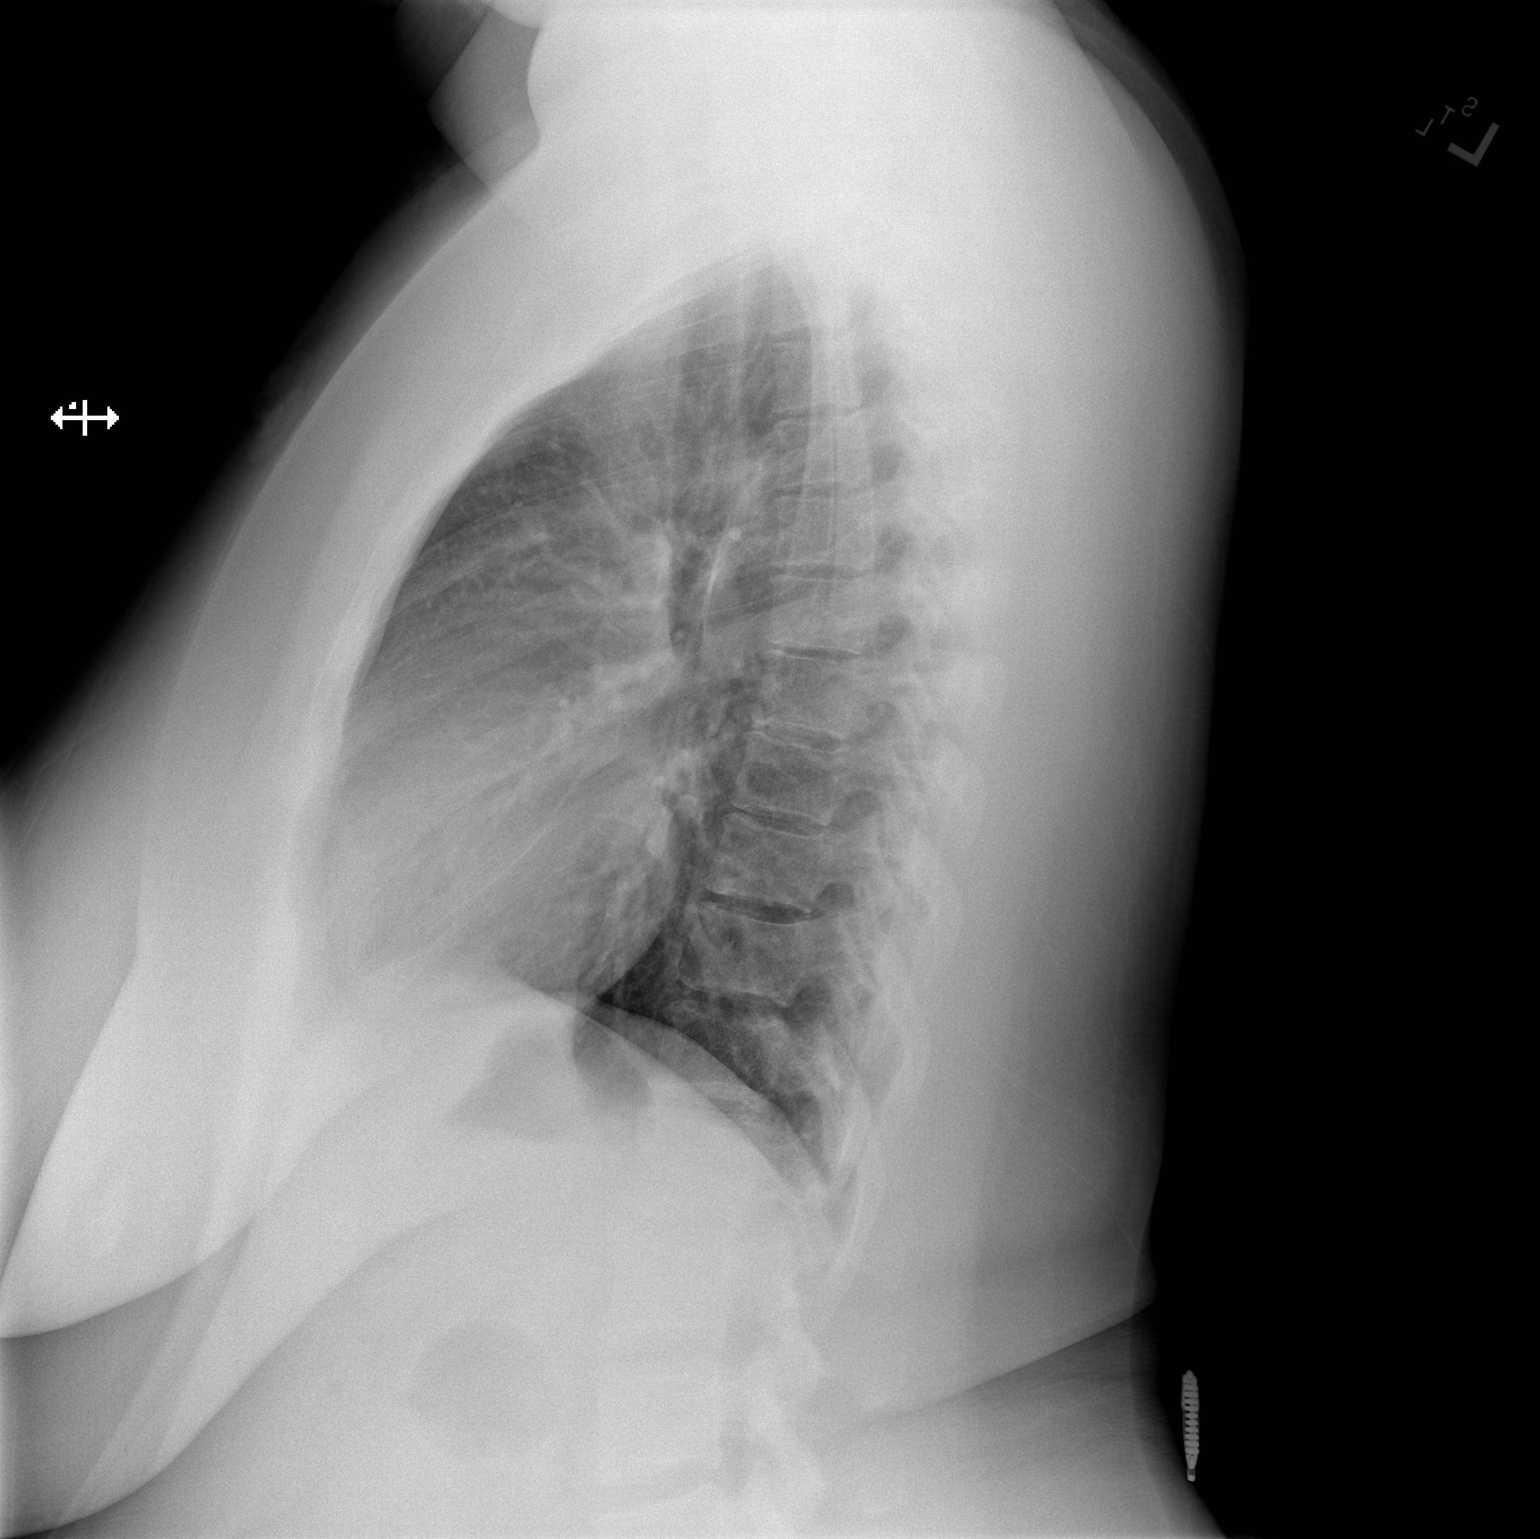

[2 of 2 positions shown; findings below may reference images not displayed]

FINDINGS: The heart size and mediastinal contours are within normal limits.
Both lungs are clear. The visualized skeletal structures are
unremarkable.
IMPRESSION: No active cardiopulmonary disease.

## 2021-10-18 IMAGING — US US THYROID
1 series · 13 of 25 positions shown · non-contrast
Comparison: None.

CLINICAL DATA: Palpable abnormality.  Goiter on examination.

EXAM:
THYROID ULTRASOUND
TECHNIQUE: Ultrasound examination of the thyroid gland and adjacent soft
tissues was performed.

[Series 1: us thyroid · 0.06mm/px · 53 acquisitions, 13 frames shown]
[im 1/53]
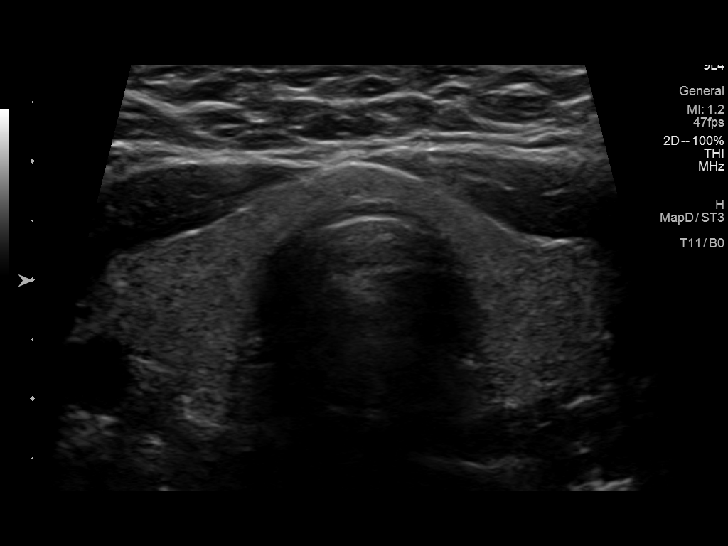
[im 5/53]
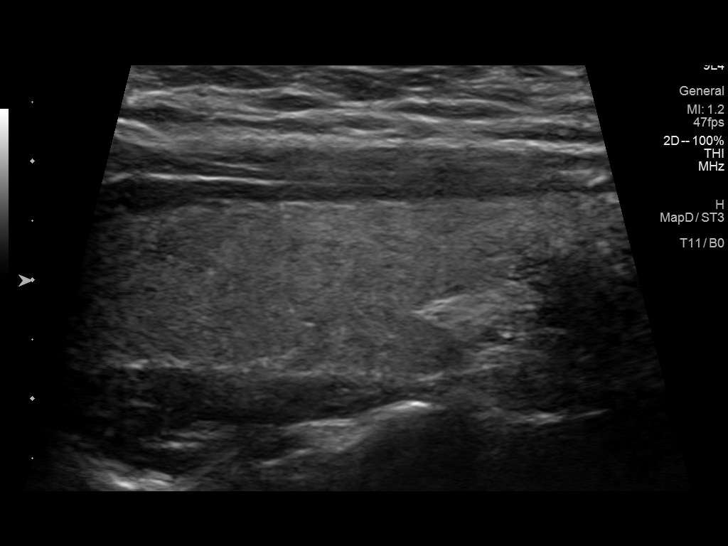
[im 9/53]
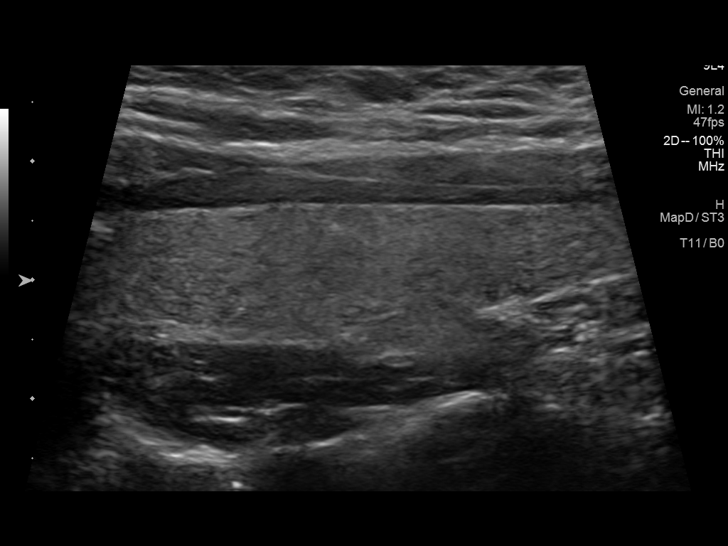
[im 14/53]
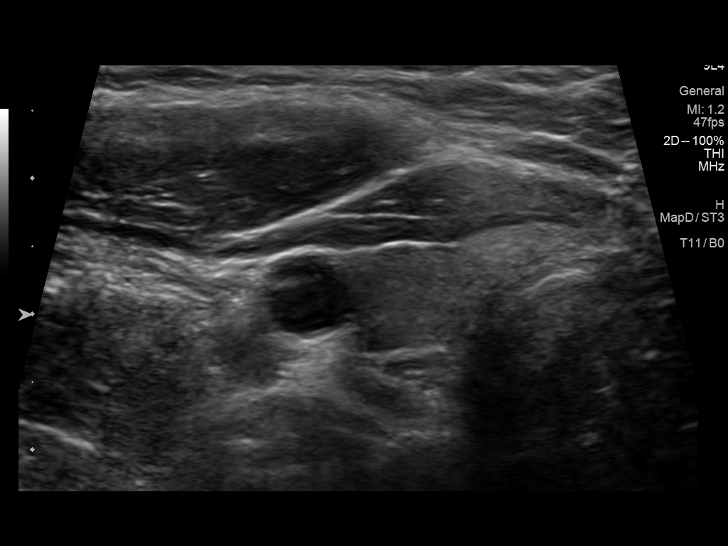
[im 18/53]
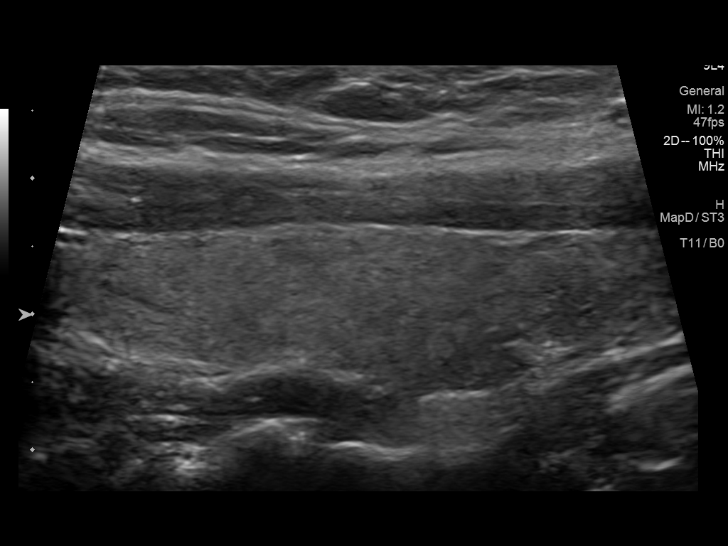
[im 22/53]
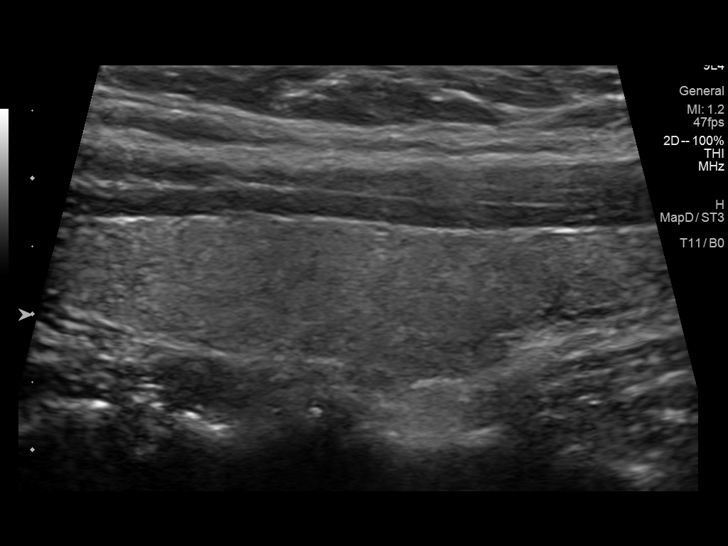
[im 27/53]
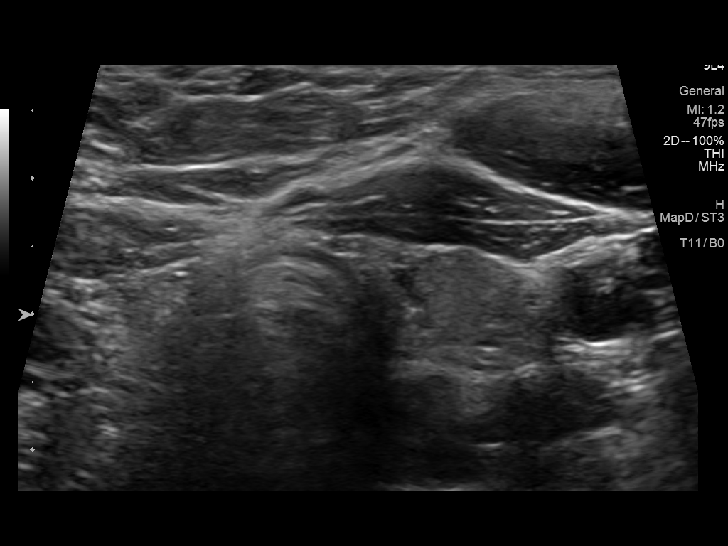
[im 31/53]
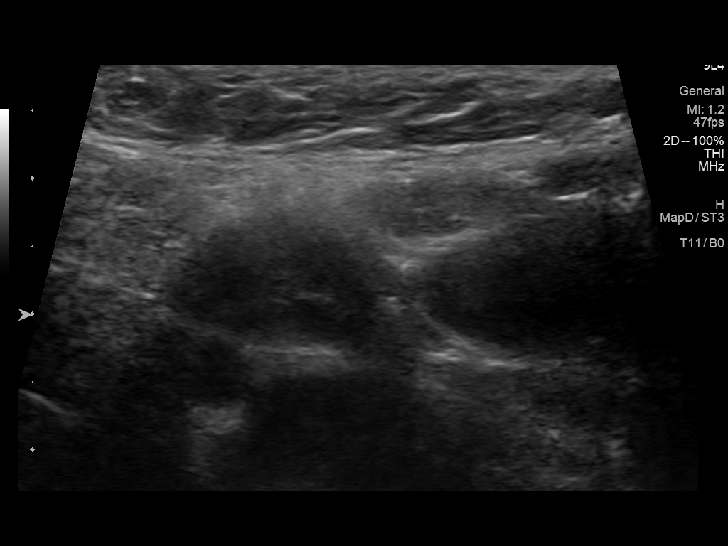
[im 35/53]
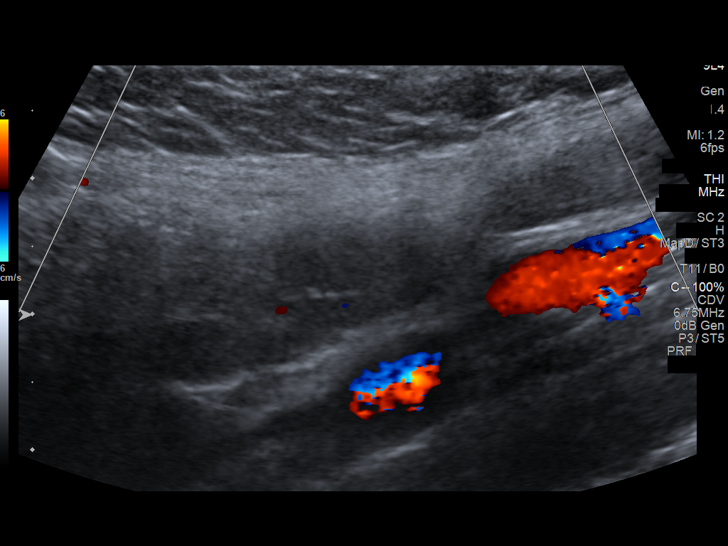
[im 40/53]
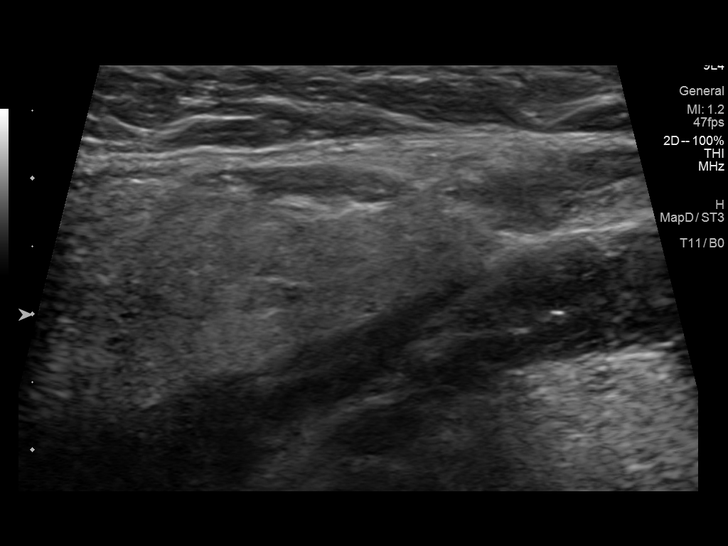
[im 44/53]
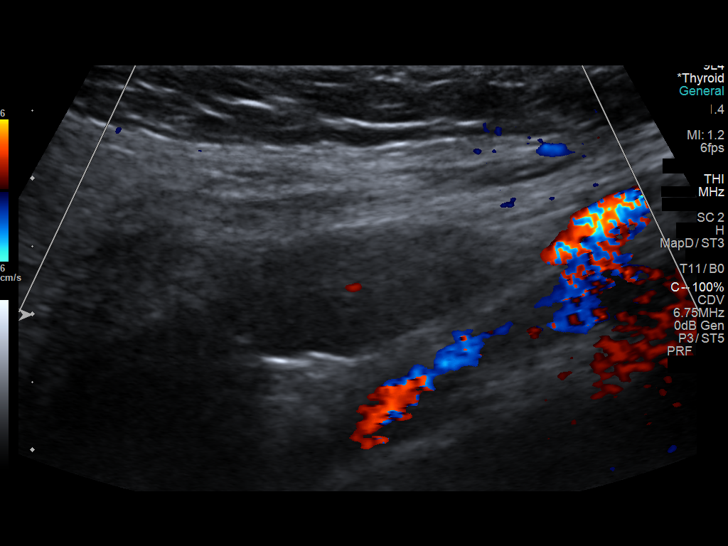
[im 48/53]
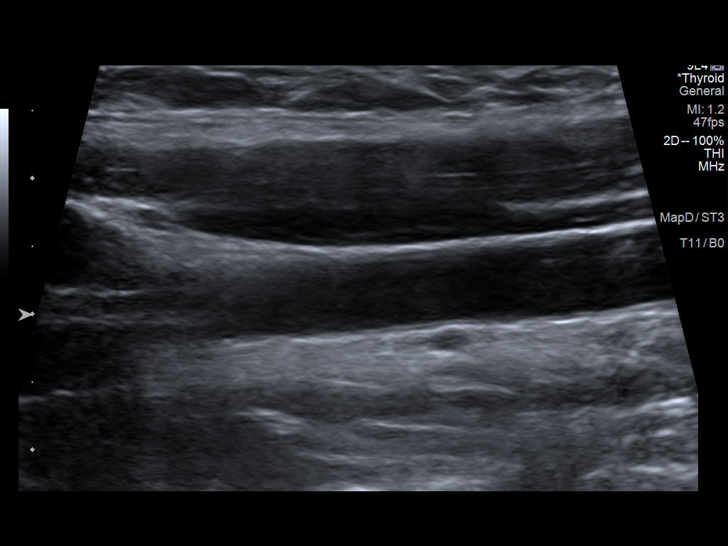
[im 53/53]
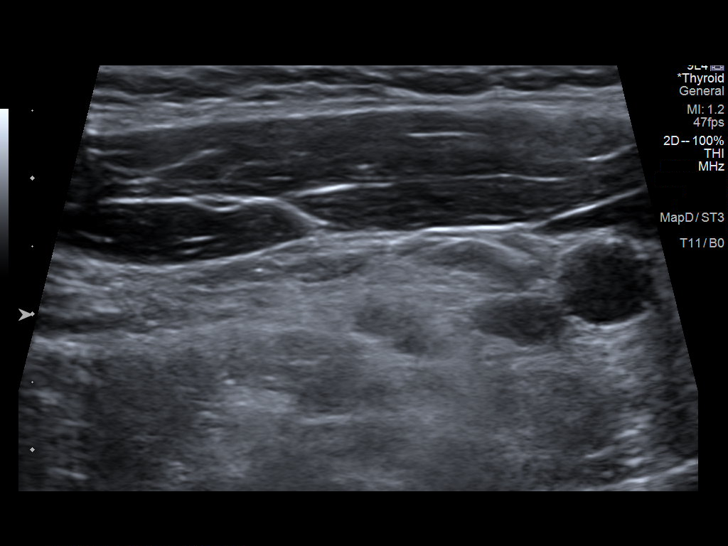

[13 of 25 positions shown; findings below may reference images not displayed]

FINDINGS: Parenchymal Echotexture: Normal

Isthmus: Normal in size measures 0.4 cm in diameter

Right lobe: Normal in size measuring 4.9 x 1.2 x 1.4 cm

Left lobe: Normal in size measuring 4.7 x 1.4 x 1.5 cm

_________________________________________________________

Estimated total number of nodules >/= 1 cm: 0

Number of spongiform nodules >/=  2 cm not described below (TR1): 0

Number of mixed cystic and solid nodules >/= 1.5 cm not described
below (TR2): 0

_________________________________________________________

No discrete nodules are seen within the thyroid gland.

Mildly prominent though non pathologically enlarged cervical lymph
nodes are seen bilaterally with index left-sided cervical lymph node
measuring 1 cm in greatest short axis diameter (image 33) and index
right cervical lymph node measuring 0.9 cm (image 43), presumably
reactive in etiology.
IMPRESSION: 1. Normal thyroid ultrasound. Specifically, no evidence of
thyromegaly or discrete thyroid nodule or mass.
2. Mildly prominent though non pathologically enlarged cervical
lymph nodes are seen bilaterally, presumably reactive in etiology.

The above is in keeping with the ACR TI-RADS recommendations - [HOSPITAL] 4936;[DATE].

## 2021-12-13 DIAGNOSIS — R102 Pelvic and perineal pain: Secondary | ICD-10-CM | POA: Insufficient documentation

## 2022-01-15 ENCOUNTER — Inpatient Hospital Stay: Payer: Self-pay | Attending: Oncology | Admitting: Oncology

## 2022-01-15 ENCOUNTER — Encounter: Payer: Self-pay | Admitting: Oncology

## 2022-01-15 ENCOUNTER — Inpatient Hospital Stay: Payer: Self-pay

## 2022-01-15 VITALS — BP 109/72 | HR 65 | Temp 98.5°F | Resp 18 | Ht 62.0 in | Wt 238.0 lb

## 2022-01-15 DIAGNOSIS — I7300 Raynaud's syndrome without gangrene: Secondary | ICD-10-CM

## 2022-01-15 DIAGNOSIS — R002 Palpitations: Secondary | ICD-10-CM | POA: Insufficient documentation

## 2022-01-15 DIAGNOSIS — R5383 Other fatigue: Secondary | ICD-10-CM | POA: Insufficient documentation

## 2022-01-15 DIAGNOSIS — R5382 Chronic fatigue, unspecified: Secondary | ICD-10-CM | POA: Insufficient documentation

## 2022-01-15 DIAGNOSIS — F419 Anxiety disorder, unspecified: Secondary | ICD-10-CM | POA: Insufficient documentation

## 2022-01-15 DIAGNOSIS — Z888 Allergy status to other drugs, medicaments and biological substances status: Secondary | ICD-10-CM | POA: Insufficient documentation

## 2022-01-15 DIAGNOSIS — D649 Anemia, unspecified: Secondary | ICD-10-CM

## 2022-01-15 DIAGNOSIS — Z882 Allergy status to sulfonamides status: Secondary | ICD-10-CM | POA: Insufficient documentation

## 2022-01-15 DIAGNOSIS — R0602 Shortness of breath: Secondary | ICD-10-CM | POA: Insufficient documentation

## 2022-01-15 DIAGNOSIS — G4700 Insomnia, unspecified: Secondary | ICD-10-CM | POA: Insufficient documentation

## 2022-01-15 DIAGNOSIS — Z881 Allergy status to other antibiotic agents status: Secondary | ICD-10-CM | POA: Insufficient documentation

## 2022-01-15 LAB — CBC WITH DIFFERENTIAL/PLATELET
Abs Immature Granulocytes: 0.01 10*3/uL (ref 0.00–0.07)
Basophils Absolute: 0 10*3/uL (ref 0.0–0.1)
Basophils Relative: 0 %
Eosinophils Absolute: 0.1 10*3/uL (ref 0.0–0.5)
Eosinophils Relative: 1 %
HCT: 35 % — ABNORMAL LOW (ref 36.0–46.0)
Hemoglobin: 10.9 g/dL — ABNORMAL LOW (ref 12.0–15.0)
Immature Granulocytes: 0 %
Lymphocytes Relative: 30 %
Lymphs Abs: 1.2 10*3/uL (ref 0.7–4.0)
MCH: 25.7 pg — ABNORMAL LOW (ref 26.0–34.0)
MCHC: 31.1 g/dL (ref 30.0–36.0)
MCV: 82.5 fL (ref 80.0–100.0)
Monocytes Absolute: 0.3 10*3/uL (ref 0.1–1.0)
Monocytes Relative: 8 %
Neutro Abs: 2.4 10*3/uL (ref 1.7–7.7)
Neutrophils Relative %: 61 %
Platelets: 303 10*3/uL (ref 150–400)
RBC: 4.24 MIL/uL (ref 3.87–5.11)
RDW: 13.7 % (ref 11.5–15.5)
WBC: 4 10*3/uL (ref 4.0–10.5)
nRBC: 0 % (ref 0.0–0.2)

## 2022-01-15 LAB — TSH: TSH: 1.841 u[IU]/mL (ref 0.350–4.500)

## 2022-01-15 LAB — COMPREHENSIVE METABOLIC PANEL
ALT: 12 U/L (ref 0–44)
AST: 20 U/L (ref 15–41)
Albumin: 4.3 g/dL (ref 3.5–5.0)
Alkaline Phosphatase: 54 U/L (ref 38–126)
Anion gap: 7 (ref 5–15)
BUN: 10 mg/dL (ref 6–20)
CO2: 25 mmol/L (ref 22–32)
Calcium: 8.6 mg/dL — ABNORMAL LOW (ref 8.9–10.3)
Chloride: 103 mmol/L (ref 98–111)
Creatinine, Ser: 0.78 mg/dL (ref 0.44–1.00)
GFR, Estimated: >60 mL/min (ref >60)
Glucose, Bld: 90 mg/dL (ref 70–99)
Potassium: 3.9 mmol/L (ref 3.5–5.1)
Sodium: 135 mmol/L (ref 135–145)
Total Bilirubin: 0.3 mg/dL (ref 0.3–1.2)
Total Protein: 8 g/dL (ref 6.5–8.1)

## 2022-01-15 LAB — IRON AND TIBC
Iron: 36 ug/dL (ref 28–170)
Saturation Ratios: 11 % (ref 10.4–31.8)
TIBC: 323 ug/dL (ref 250–450)
UIBC: 287 ug/dL

## 2022-01-15 LAB — FERRITIN: Ferritin: 24 ng/mL (ref 11–307)

## 2022-01-15 LAB — RETIC PANEL
Immature Retic Fract: 12.7 % (ref 2.3–15.9)
RBC.: 4.21 MIL/uL (ref 3.87–5.11)
Retic Count, Absolute: 49.3 10*3/uL (ref 19.0–186.0)
Retic Ct Pct: 1.2 % (ref 0.4–3.1)
Reticulocyte Hemoglobin: 28.7 pg (ref >27.9)

## 2022-01-15 LAB — VITAMIN B12: Vitamin B-12: 803 pg/mL (ref 180–914)

## 2022-01-15 LAB — FOLATE: Folate: 28 ng/mL (ref >5.9)

## 2022-01-15 MED ORDER — FERROUS SULFATE ER 142 (45 FE) MG PO TBCR: 1.0000 | EXTENDED_RELEASE_TABLET | Freq: Every day | ORAL | 1 refills | Status: DC

## 2022-01-15 NOTE — Progress Notes (Signed)
Patient here for oncology follow-up appointment, concerns dizzy spells with cheat pains and SOB especially with heavy cycle

## 2022-01-15 NOTE — Assessment & Plan Note (Signed)
Chronic fatigue, palpitation, shortness of breath With her hemoglobin above 10, fatigue/shortness of breath/palpitation symptoms appear to be out of proportion to her hemoglobin levels.   I recommend patient referral discussed with primary care provider for work-up of other etiologies of fatigue.

## 2022-01-15 NOTE — Progress Notes (Signed)
Hematology/Oncology Consult note [phone] [phone]      Patient Care Team: Pa, [facility] as PCP - General (Family Medicine)   REFERRING PROVIDER: Enid Baas, MD  CHIEF COMPLAINTS/REASON FOR VISIT:  Anemia  ASSESSMENT & PLAN:  Normocytic anemia Chronic normocytic anemia Check CBC, CMP, TSH, vitamin B12, folate, iron, TIBC ferritin, reticulocyte panel.  Hemoglobinopathy evaluation. -CBC showed a hemoglobin of 10.9, iron saturation 11, ferritin 24, TIBC 323. With the borderline iron saturation, she may have a component of iron deficiency Recommend patient to take oral iron supplementation and repeat blood work in 3 months.    Raynaud's phenomenon without gangrene Check ANA with reflex  Hypocalcemia Recommend patient to take over-the-counter calcium 500-600 mg daily.  Fatigue Chronic fatigue, palpitation, shortness of breath With her hemoglobin above 10, fatigue/shortness of breath/palpitation symptoms appear to be out of proportion to her hemoglobin levels.   I recommend patient referral discussed with primary care provider for work-up of other etiologies of fatigue.  Orders Placed This Encounter  Procedures   CBC with Differential/Platelet    Standing Status:   Future    Number of Occurrences:   1    Standing Expiration Date:   01/16/2023   Comprehensive metabolic panel    Standing Status:   Future    Number of Occurrences:   1    Standing Expiration Date:   01/16/2023   TSH    Standing Status:   Future    Number of Occurrences:   1    Standing Expiration Date:   01/16/2023   Vitamin B12    Standing Status:   Future    Number of Occurrences:   1    Standing Expiration Date:   01/16/2023   Folate    Standing Status:   Future    Number of Occurrences:   1    Standing Expiration Date:   01/16/2023   Ferritin    Standing Status:   Future    Number of Occurrences:   1    Standing Expiration Date:   07/18/2022   Iron  and TIBC    Standing Status:   Future    Number of Occurrences:   1    Standing Expiration Date:   01/16/2023   Hgb Fractionation Cascade    Standing Status:   Future    Number of Occurrences:   1    Standing Expiration Date:   01/16/2023   ANA, IFA (with reflex)    Standing Status:   Future    Number of Occurrences:   1    Standing Expiration Date:   01/16/2023   Retic Panel    Standing Status:   Future    Number of Occurrences:   1    Standing Expiration Date:   01/16/2023   Follow-up in 3 months. All questions were answered. The patient knows to call the clinic with any problems, questions or concerns.  Rickard Patience, MD, PhD [facility] Hematology Oncology 01/15/2022     HISTORY OF PRESENTING ILLNESS:  Kara Collins is a  26 y.o.  female with PMH listed below who was referred to me for anemia Reviewed patient's recent labs that was done.  She was found to have abnormal CBC on 09/08/2021, with hemoglobin 11.6, MCV 83. She has chronic anemia. Patient reports feeling tired, body feels heavy, when she stands up, she experiences" jumping sensation in her brain", occasionally she experiences shortness of breath, heart palpitation. She also has  insomnia, she feels cold all the time.  In the winter her fingers turn blue.  She denies recent chest pain on exertion,  pre-syncopal episodes She had not noticed any recent bleeding such as epistaxis, hematuria or hematochezia.  She denies over the counter NSAID ingestion.  She denies any pica and eats a variety of diet. Patient takes over-the-counter floradix  Patient reports that she always has to take iron to start her.menses.  +Anxiety   MEDICAL HISTORY:  Past Medical History:  Diagnosis Date   Abortion    Anemia    Ankle fracture 01/2021   Asthma    Hematoma 01/2021   abdominal    SURGICAL HISTORY: Past Surgical History:  Procedure Laterality Date   CESAREAN SECTION     DILATION AND CURETTAGE OF UTERUS      SOCIAL  HISTORY: Social History   Socioeconomic History   Marital status: Married    Spouse name: Not on file   Number of children: Not on file   Years of education: Not on file   Highest education level: Not on file  Occupational History   Not on file  Tobacco Use   Smoking status: Never   Smokeless tobacco: Never  Vaping Use   Vaping Use: Never used  Substance and Sexual Activity   Alcohol use: Never   Drug use: Never   Sexual activity: Yes    Birth control/protection: Condom  Other Topics Concern   Not on file  Social History Narrative   Not on file   Social Determinants of Health   Financial Resource Strain: Not on file  Food Insecurity: Not on file  Transportation Needs: Not on file  Physical Activity: Not on file  Stress: Not on file  Social Connections: Not on file  Intimate Partner Violence: Not on file    FAMILY HISTORY: Family History  Problem Relation Age of Onset   Other Mother    Healthy Father     ALLERGIES:  is allergic to cephalexin, pollen extract, sulfa antibiotics, and zofran [ondansetron hcl].  MEDICATIONS:  Current Outpatient Medications  Medication Sig Dispense Refill   Acetaminophen 325 MG CAPS Take by mouth.     Acetaminophen 500 MG capsule Take by mouth.     albuterol (VENTOLIN HFA) 108 (90 Base) MCG/ACT inhaler Inhale 2 puffs into the lungs every 4 (four) hours as needed for wheezing or shortness of breath. 18 g 0   ibuprofen (ADVIL) 600 MG tablet Take 1 tablet (600 mg total) by mouth every 6 (six) hours as needed. 30 tablet 0   MOUNJARO 2.5 MG/0.5ML Pen SMARTSIG:2.5 Milligram(s) SUB-Q Once a Week     Multiple Vitamin (MULTI-VITAMIN) tablet Take 1 tablet by mouth daily.     pediatric multivitamin + iron (POLY-VI-SOL +IRON) 10 MG/ML oral solution Take by mouth daily. Liquid iron 20 mg daily     aspirin 81 MG chewable tablet Chew by mouth. (Patient not taking: Reported on 01/15/2022)     ferrous sulfate 325 (65 FE) MG tablet Take by mouth.  (Patient not taking: Reported on 01/15/2022)     fluconazole (DIFLUCAN) 10 mg/mL SUSP Take by mouth. (Patient not taking: Reported on 01/15/2022)     meclizine (ANTIVERT) 12.5 MG tablet Take 1 tablet (12.5 mg total) by mouth 3 (three) times daily as needed for dizziness. (Patient not taking: Reported on 01/15/2022) 30 tablet 0   No current facility-administered medications for this visit.    Review of Systems  Constitutional:  Positive for  fatigue. Negative for appetite change, chills and fever.  HENT:   Negative for hearing loss and voice change.   Eyes:  Negative for eye problems.  Respiratory:  Positive for shortness of breath. Negative for chest tightness and cough.   Cardiovascular:  Positive for palpitations. Negative for chest pain.  Gastrointestinal:  Negative for abdominal distention, abdominal pain and blood in stool.  Endocrine: Negative for hot flashes.  Genitourinary:  Positive for menstrual problem. Negative for difficulty urinating and frequency.   Musculoskeletal:  Negative for arthralgias.  Skin:  Negative for itching and rash.  Neurological:  Negative for extremity weakness.  Hematological:  Negative for adenopathy.  Psychiatric/Behavioral:  Negative for confusion.     PHYSICAL EXAMINATION: ECOG PERFORMANCE STATUS: 1 - Symptomatic but completely ambulatory Vitals:   01/15/22 1106  BP: 109/72  Pulse: 65  Resp: 18  Temp: 98.5 F (36.9 C)  SpO2: 100%   Filed Weights   01/15/22 1106  Weight: 238 lb (108 kg)    Physical Exam Constitutional:      General: She is not in acute distress. HENT:     Head: Normocephalic and atraumatic.  Eyes:     General: No scleral icterus. Cardiovascular:     Rate and Rhythm: Normal rate and regular rhythm.     Heart sounds: Normal heart sounds.  Pulmonary:     Effort: Pulmonary effort is normal. No respiratory distress.     Breath sounds: No wheezing.  Abdominal:     General: Bowel sounds are normal. There is no distension.      Palpations: Abdomen is soft.  Musculoskeletal:        General: No deformity. Normal range of motion.     Cervical back: Normal range of motion and neck supple.  Skin:    General: Skin is warm and dry.     Findings: No erythema or rash.  Neurological:     Mental Status: She is alert and oriented to person, place, and time. Mental status is at baseline.     Cranial Nerves: No cranial nerve deficit.     Coordination: Coordination normal.  Psychiatric:        Mood and Affect: Mood normal.      LABORATORY DATA:  I have reviewed the data as listed    Latest Ref Rng & Units 01/15/2022   12:01 PM 06/03/2020    7:13 PM 01/31/2020   12:44 PM  CBC  WBC 4.0 - 10.5 K/uL 4.0  6.1  4.4   Hemoglobin 12.0 - 15.0 g/dL 51.7  00.1  74.9   Hematocrit 36.0 - 46.0 % 35.0  35.3  38.3   Platelets 150 - 400 K/uL 303  307  315       Latest Ref Rng & Units 01/15/2022   12:01 PM 06/03/2020    7:13 PM 01/31/2020   12:44 PM  CMP  Glucose 70 - 99 mg/dL 90  94  88   BUN 6 - 20 mg/dL 10  11  8    Creatinine 0.44 - 1.00 mg/dL  4.49  6.75   Sodium 135 - 145 mmol/L 135  137  141   Potassium 3.5 - 5.1 mmol/L 3.9  4.3  4.1   Chloride 98 - 111 mmol/L 103  105  106   CO2 22 - 32 mmol/L 25  22  23    Calcium 8.9 - 10.3 mg/dL 8.6  9.0  9.5   Total Protein 6.5 - 8.1 g/dL 8.0  7.9    Total Bilirubin 0.3 - 1.2 mg/dL 0.3  0.1    Alkaline Phos 38 - 126 U/L 54  51    AST 15 - 41 U/L 20  21    ALT 0 - 44 U/L 12  17        Component Value Date/Time   IRON 36 01/15/2022 1201   TIBC 323 01/15/2022 1201   FERRITIN 24 01/15/2022 1201   IRONPCTSAT 11 01/15/2022 1201     RADIOGRAPHIC STUDIES: I have personally reviewed the radiological images as listed and agreed with the findings in the report. No results found.

## 2022-01-15 NOTE — Assessment & Plan Note (Signed)
Recommend patient to take over-the-counter calcium 500-600 mg daily.

## 2022-01-15 NOTE — Assessment & Plan Note (Signed)
Chronic normocytic anemia Check CBC, CMP, TSH, vitamin B12, folate, iron, TIBC ferritin, reticulocyte panel.  Hemoglobinopathy evaluation. -CBC showed a hemoglobin of 10.9, iron saturation 11, ferritin 24, TIBC 323. With the borderline iron saturation, she may have a component of iron deficiency Recommend patient to take oral iron supplementation and repeat blood work in 3 months.

## 2022-01-15 NOTE — Assessment & Plan Note (Signed)
Check ANA with reflex

## 2022-01-16 ENCOUNTER — Telehealth: Payer: Self-pay

## 2022-01-16 NOTE — Telephone Encounter (Signed)
-----   Message from Rickard Patience, MD sent at 01/15/2022  9:21 PM EDT ----- Please let patient know that her blood work showed hemoglobin of 10.9, iron level is normal, borderline at low normal end.  I recommend patient to take oral iron supplementation.  Slow Fe prescription were sent.  The over-the-counter liquid iron does not have enough iron in it. She may also take over-the-counter vitamin C 500 mg daily to help with iron absorption for Calcium level is slightly decreased, recommend patient to take over-the-counter calcium 500 or 600 mg daily.  Patient currently has an appointment scheduled with me in 3 to 4 weeks.  She may keep it if she has further questions.  If she does not have further questions, I recommend to remove her appointment to 3 months to repeat blood work for evaluation of response to oral iron supplementation.  Lab MD, labs are ordered

## 2022-01-16 NOTE — Telephone Encounter (Signed)
Called and spoke with patients husband. Informed him of results and Dr. Bethanne Ginger recommendation. Advised to move f/u appt to 3 months unless they have further questions. Husband verbalized understanding.   [place], please move f/u appt to 3 months.  Labs MD 2 days after. Please notify patient of appt. Thanks

## 2022-01-17 LAB — HGB FRACTIONATION CASCADE
Hgb A2: 2.5 % (ref 1.8–3.2)
Hgb A: 97.5 % (ref 96.4–98.8)
Hgb F: 0 % (ref 0.0–2.0)
Hgb S: 0 %

## 2022-01-17 LAB — ANTINUCLEAR ANTIBODIES, IFA: ANA Ab, IFA: NEGATIVE

## 2022-02-13 ENCOUNTER — Ambulatory Visit: Payer: Self-pay | Admitting: Oncology

## 2022-02-14 IMAGING — US US OB TRANSVAGINAL
1 series · 13 of 28 positions shown · non-contrast
Comparison: None.

CLINICAL DATA: Status post fall with abdominal pain.

EXAM:
OBSTETRIC <14 WK US AND TRANSVAGINAL OB US
TECHNIQUE: Both transabdominal and transvaginal ultrasound examinations were
performed for complete evaluation of the gestation as well as the
maternal uterus, adnexal regions, and pelvic cul-de-sac.
Transvaginal technique was performed to assess early pregnancy.

[Series 1: us ob transvaginal · 13 of 80 slices shown]
[im 3/80]
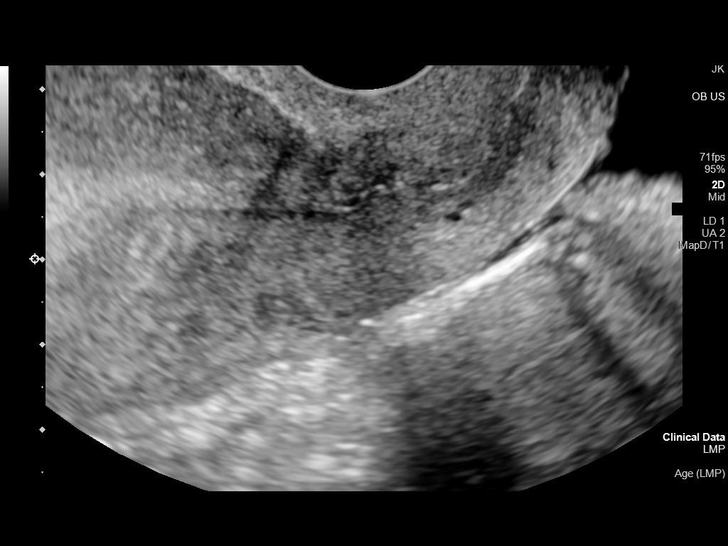
[im 9/80]
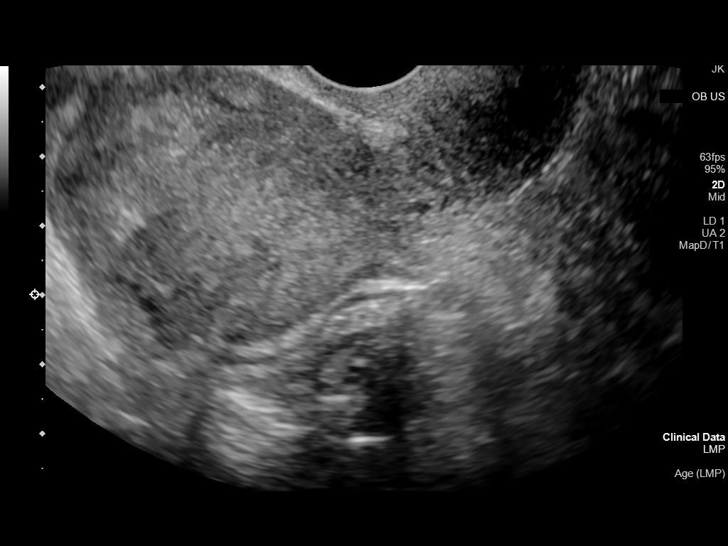
[im 15/80]
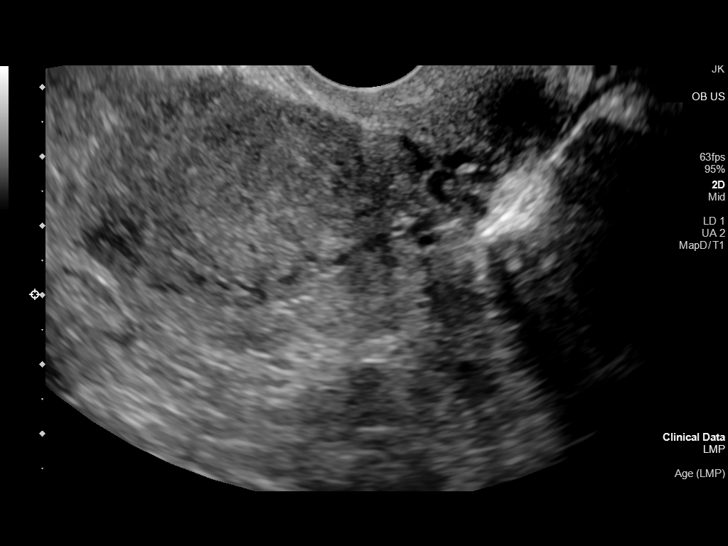
[im 21/80]
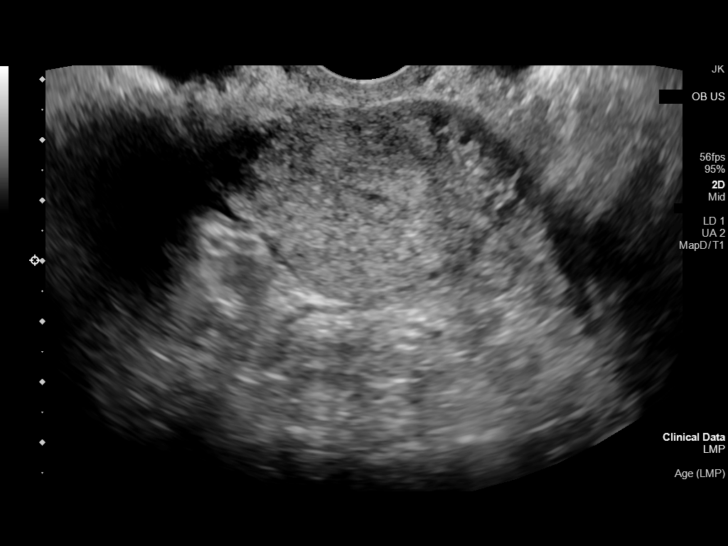
[im 27/80]
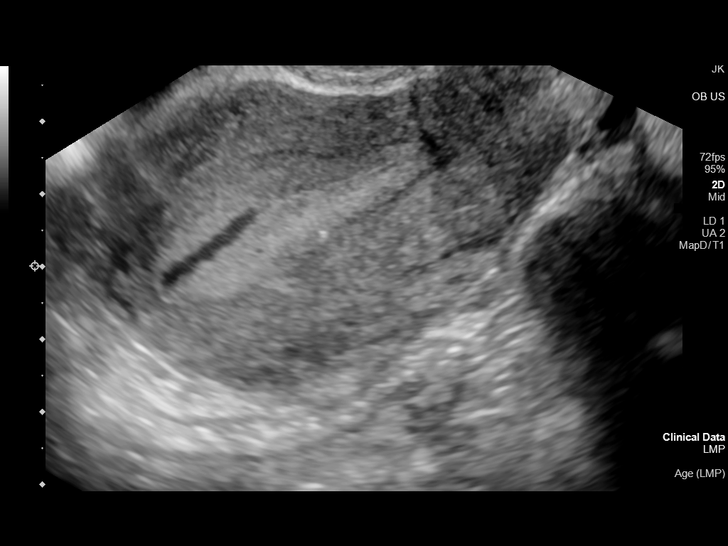
[im 33/80]
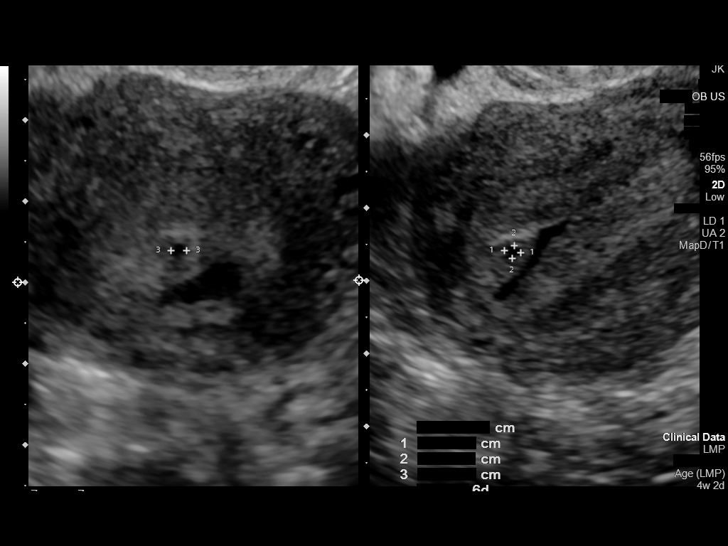
[im 41/80]
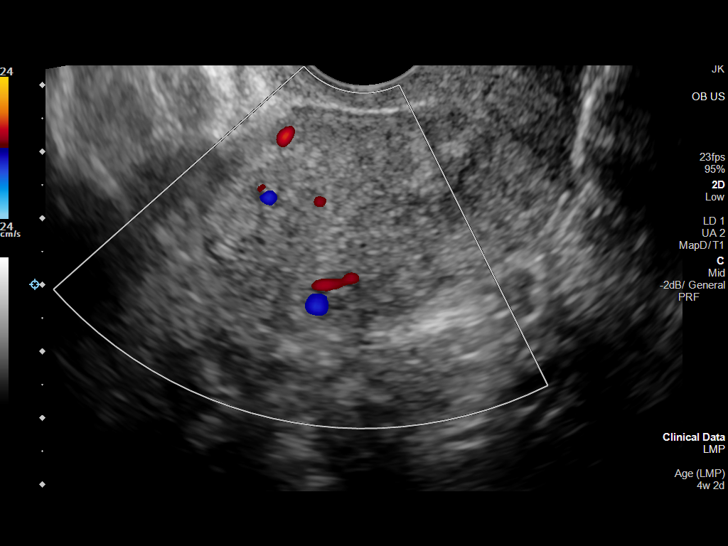
[im 47/80]
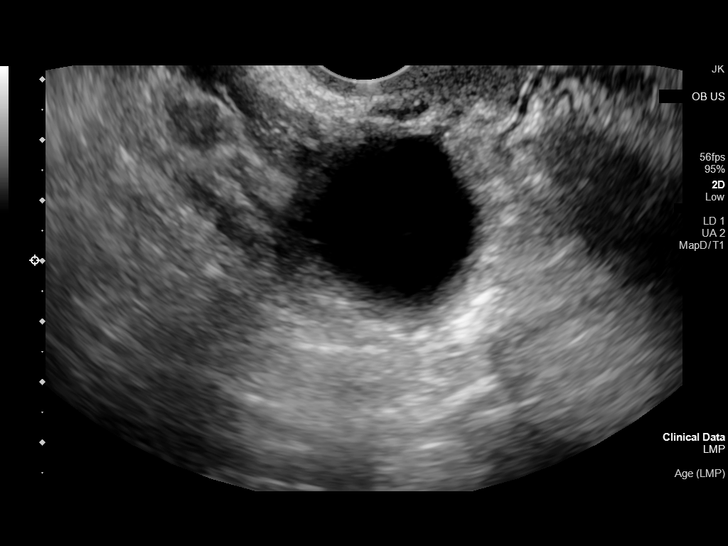
[im 53/80]
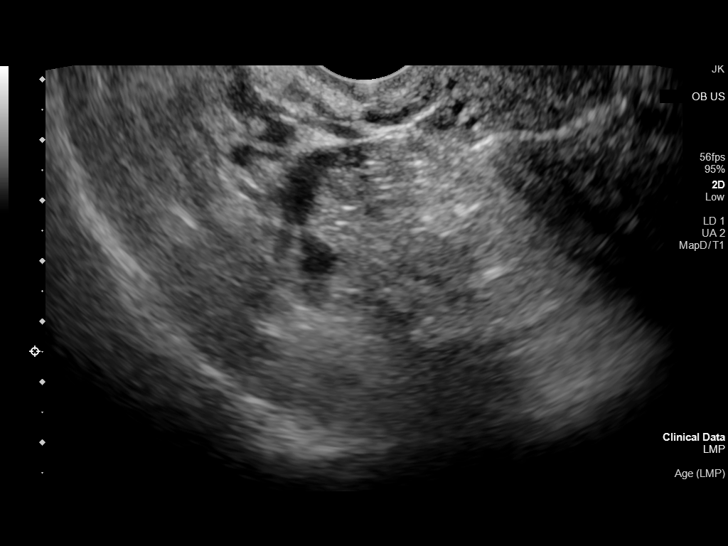
[im 59/80]
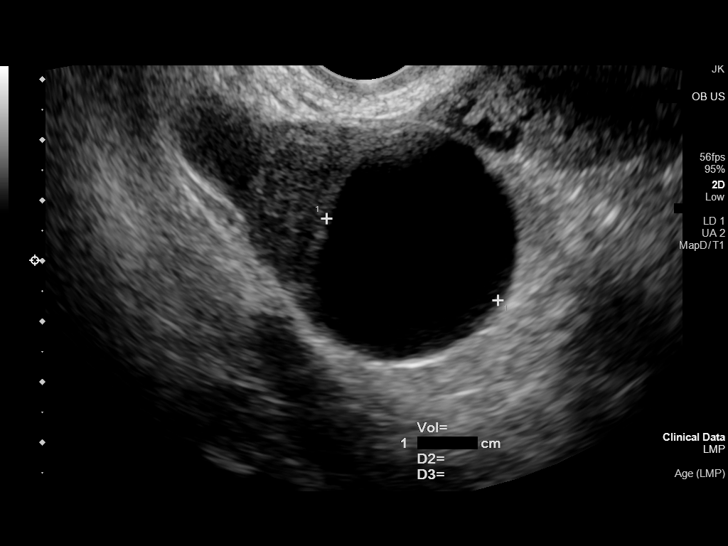
[im 65/80]
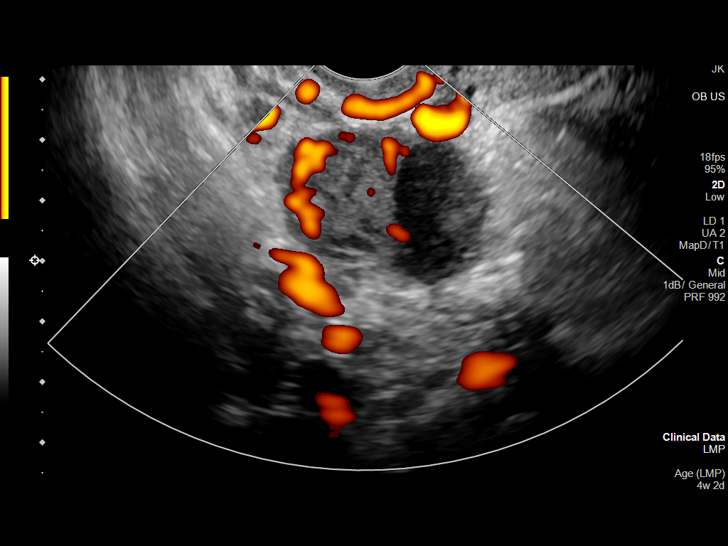
[im 71/80]
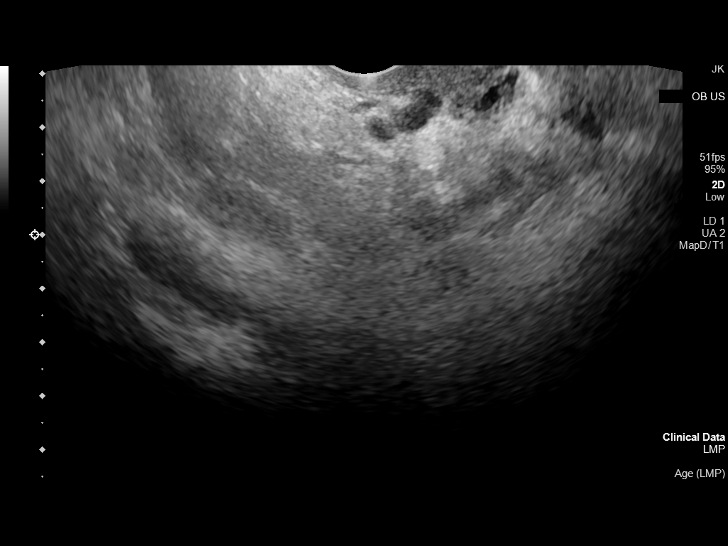
[im 77/80]
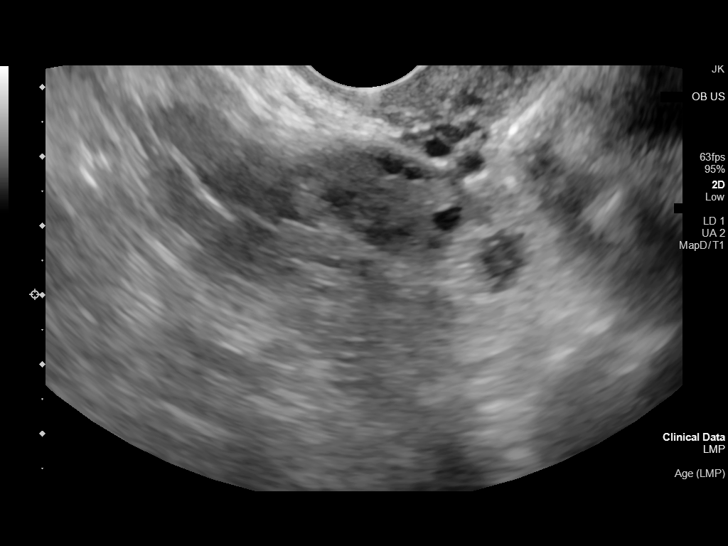

[13 of 28 positions shown; findings below may reference images not displayed]

FINDINGS: Intrauterine gestational sac: Single

Yolk sac:  Not Visualized.

Embryo:  Not Visualized.

Cardiac Activity: Not Visualized.

Heart Rate: N/A  bpm

MSD: 2 mm   4 w   6 d

Subchorionic hemorrhage:  Moderate sized.

Maternal uterus/adnexae: A 2.9 cm x 2.3 cm x 3.5 cm heterogeneous
right uterine fundal fibroid is suspected.

A 3.5 cm x 3.5 cm x 3.1 cm anechoic, cystic appearing structure is
seen within the right ovary. An additional 2.1 cm x 2.0 cm x 1.7 cm
heterogeneous right ovarian structure is noted.

A 1.2 cm x 1.0 cm x 0.9 cm that para ovarian cyst is seen adjacent
to the left ovary.

A moderate amount of pelvic free fluid is noted.
IMPRESSION: 1. A single intrauterine gestational sac, at approximately 4 weeks
and 2 days gestation by ultrasound evaluation, without visualization
of a yolk sac or fetal pole. While this may be secondary to early
intrauterine pregnancy, correlation with follow-up pelvic ultrasound
and serial beta HCG levels is recommended.
2. Large right ovarian simple cyst with additional findings which
may represent an adjacent right ovarian corpus luteum cyst.
Follow-up pelvic ultrasound is again recommended.
3. Left-sided para ovarian cyst.
4. Moderate sized subchorionic hemorrhage.
5. Right uterine fundal fibroid.

## 2022-03-16 ENCOUNTER — Ambulatory Visit: Payer: Self-pay

## 2022-03-20 ENCOUNTER — Other Ambulatory Visit (HOSPITAL_COMMUNITY): Payer: Self-pay | Admitting: Sports Medicine

## 2022-03-20 ENCOUNTER — Other Ambulatory Visit: Payer: Self-pay | Admitting: Sports Medicine

## 2022-03-20 DIAGNOSIS — S8255XS Nondisplaced fracture of medial malleolus of left tibia, sequela: Secondary | ICD-10-CM

## 2022-03-20 DIAGNOSIS — M7662 Achilles tendinitis, left leg: Secondary | ICD-10-CM

## 2022-03-20 DIAGNOSIS — G8929 Other chronic pain: Secondary | ICD-10-CM

## 2022-03-22 ENCOUNTER — Ambulatory Visit: Payer: BC Managed Care – PPO | Attending: Sports Medicine | Admitting: Occupational Therapy

## 2022-03-22 ENCOUNTER — Encounter: Payer: Self-pay | Admitting: Occupational Therapy

## 2022-03-22 DIAGNOSIS — M25532 Pain in left wrist: Secondary | ICD-10-CM | POA: Diagnosis present

## 2022-03-22 DIAGNOSIS — G5622 Lesion of ulnar nerve, left upper limb: Secondary | ICD-10-CM | POA: Insufficient documentation

## 2022-03-22 DIAGNOSIS — M25522 Pain in left elbow: Secondary | ICD-10-CM | POA: Insufficient documentation

## 2022-03-22 DIAGNOSIS — M7702 Medial epicondylitis, left elbow: Secondary | ICD-10-CM | POA: Insufficient documentation

## 2022-03-22 DIAGNOSIS — M659 Synovitis and tenosynovitis, unspecified: Secondary | ICD-10-CM | POA: Insufficient documentation
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
## 2022-03-26 ENCOUNTER — Ambulatory Visit
Admission: RE | Admit: 2022-03-26 | Discharge: 2022-03-26 | Disposition: A | Discharge status: Home / Self Care | Payer: BC Managed Care – PPO | Source: Ambulatory Visit | Attending: Sports Medicine | Admitting: Sports Medicine

## 2022-03-26 DIAGNOSIS — S8255XS Nondisplaced fracture of medial malleolus of left tibia, sequela: Secondary | ICD-10-CM | POA: Insufficient documentation

## 2022-03-26 DIAGNOSIS — M25572 Pain in left ankle and joints of left foot: Secondary | ICD-10-CM | POA: Insufficient documentation

## 2022-03-26 DIAGNOSIS — M7662 Achilles tendinitis, left leg: Secondary | ICD-10-CM | POA: Diagnosis present

## 2022-03-26 DIAGNOSIS — G8929 Other chronic pain: Secondary | ICD-10-CM | POA: Insufficient documentation

## 2022-03-29 ENCOUNTER — Ambulatory Visit: Payer: BC Managed Care – PPO | Admitting: Occupational Therapy

## 2022-03-29 DIAGNOSIS — M25532 Pain in left wrist: Secondary | ICD-10-CM

## 2022-03-29 DIAGNOSIS — M7702 Medial epicondylitis, left elbow: Secondary | ICD-10-CM

## 2022-03-29 DIAGNOSIS — G5622 Lesion of ulnar nerve, left upper limb: Secondary | ICD-10-CM | POA: Diagnosis not present

## 2022-03-29 DIAGNOSIS — M25522 Pain in left elbow: Secondary | ICD-10-CM

## 2022-03-29 DIAGNOSIS — M659 Synovitis and tenosynovitis, unspecified: Secondary | ICD-10-CM
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
## 2022-04-02 ENCOUNTER — Ambulatory Visit: Payer: BC Managed Care – PPO | Admitting: Occupational Therapy

## 2022-04-02 DIAGNOSIS — M25532 Pain in left wrist: Secondary | ICD-10-CM

## 2022-04-02 DIAGNOSIS — G5622 Lesion of ulnar nerve, left upper limb: Secondary | ICD-10-CM | POA: Diagnosis not present

## 2022-04-02 DIAGNOSIS — M659 Synovitis and tenosynovitis, unspecified: Secondary | ICD-10-CM

## 2022-04-02 DIAGNOSIS — M7702 Medial epicondylitis, left elbow: Secondary | ICD-10-CM

## 2022-04-02 DIAGNOSIS — M25522 Pain in left elbow: Secondary | ICD-10-CM
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
## 2022-04-04 ENCOUNTER — Ambulatory Visit: Payer: BC Managed Care – PPO | Admitting: Occupational Therapy

## 2022-04-09 ENCOUNTER — Ambulatory Visit: Payer: BC Managed Care – PPO | Admitting: Occupational Therapy

## 2022-04-17 ENCOUNTER — Inpatient Hospital Stay: Payer: BC Managed Care – PPO | Attending: Oncology

## 2022-04-17 ENCOUNTER — Ambulatory Visit: Payer: BC Managed Care – PPO | Admitting: Occupational Therapy

## 2022-04-19 ENCOUNTER — Ambulatory Visit: Payer: BC Managed Care – PPO | Admitting: Physical Therapy

## 2022-04-19 ENCOUNTER — Ambulatory Visit: Payer: Self-pay | Admitting: Oncology

## 2022-04-19 ENCOUNTER — Inpatient Hospital Stay: Payer: BC Managed Care – PPO | Admitting: Oncology

## 2022-04-24 ENCOUNTER — Ambulatory Visit: Payer: BC Managed Care – PPO | Attending: Sports Medicine | Admitting: Occupational Therapy

## 2022-04-24 DIAGNOSIS — G5622 Lesion of ulnar nerve, left upper limb: Secondary | ICD-10-CM | POA: Insufficient documentation

## 2022-04-24 DIAGNOSIS — M25522 Pain in left elbow: Secondary | ICD-10-CM | POA: Diagnosis present

## 2022-04-24 DIAGNOSIS — M25572 Pain in left ankle and joints of left foot: Secondary | ICD-10-CM | POA: Diagnosis present

## 2022-04-24 DIAGNOSIS — M659 Synovitis and tenosynovitis, unspecified: Secondary | ICD-10-CM | POA: Diagnosis present

## 2022-04-24 DIAGNOSIS — M7702 Medial epicondylitis, left elbow: Secondary | ICD-10-CM | POA: Insufficient documentation
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
## 2022-05-01 ENCOUNTER — Ambulatory Visit: Payer: BC Managed Care – PPO | Admitting: Occupational Therapy

## 2022-05-08 ENCOUNTER — Ambulatory Visit: Payer: BC Managed Care – PPO

## 2022-05-08 DIAGNOSIS — M25572 Pain in left ankle and joints of left foot: Secondary | ICD-10-CM

## 2022-05-08 DIAGNOSIS — G5622 Lesion of ulnar nerve, left upper limb: Secondary | ICD-10-CM | POA: Diagnosis not present
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
## 2022-05-15 ENCOUNTER — Ambulatory Visit: Payer: BC Managed Care – PPO | Attending: Sports Medicine

## 2022-05-15 DIAGNOSIS — M25572 Pain in left ankle and joints of left foot: Secondary | ICD-10-CM

## 2022-05-17 ENCOUNTER — Ambulatory Visit: Payer: BC Managed Care – PPO

## 2022-05-23 ENCOUNTER — Telehealth: Payer: Self-pay | Admitting: Oncology

## 2022-05-23 ENCOUNTER — Other Ambulatory Visit: Payer: Self-pay | Admitting: Oncology

## 2022-05-23 NOTE — Telephone Encounter (Signed)
Looks like she was last seen in August. Dr. Cathie Hoops will need repeat labs and to see her before scheduling iron infusions.   Please schedule labs, then MD/ venofer 1-2 days after labs.

## 2022-05-23 NOTE — Telephone Encounter (Signed)
Patient called and states she needs Iron infusion. She saw D. Cathie Hoops last in August. Please advise on scheduling.   Thank you

## 2022-05-24 MED FILL — Iron Sucrose Inj 20 MG/ML (Fe Equiv): INTRAVENOUS | Qty: 10 | Status: AC

## 2022-05-25 ENCOUNTER — Other Ambulatory Visit: Payer: Self-pay | Admitting: Oncology

## 2022-05-25 ENCOUNTER — Inpatient Hospital Stay: Payer: BC Managed Care – PPO | Attending: Oncology

## 2022-05-25 ENCOUNTER — Telehealth: Payer: Self-pay | Admitting: Oncology

## 2022-05-25 DIAGNOSIS — R5382 Chronic fatigue, unspecified: Secondary | ICD-10-CM | POA: Insufficient documentation

## 2022-05-25 DIAGNOSIS — R5383 Other fatigue: Secondary | ICD-10-CM | POA: Insufficient documentation

## 2022-05-25 DIAGNOSIS — Z881 Allergy status to other antibiotic agents status: Secondary | ICD-10-CM | POA: Insufficient documentation

## 2022-05-25 DIAGNOSIS — R0602 Shortness of breath: Secondary | ICD-10-CM | POA: Insufficient documentation

## 2022-05-25 DIAGNOSIS — D649 Anemia, unspecified: Secondary | ICD-10-CM | POA: Insufficient documentation

## 2022-05-25 DIAGNOSIS — Z888 Allergy status to other drugs, medicaments and biological substances status: Secondary | ICD-10-CM | POA: Insufficient documentation

## 2022-05-25 DIAGNOSIS — Z79899 Other long term (current) drug therapy: Secondary | ICD-10-CM | POA: Insufficient documentation

## 2022-05-25 DIAGNOSIS — R002 Palpitations: Secondary | ICD-10-CM | POA: Insufficient documentation

## 2022-05-25 DIAGNOSIS — N920 Excessive and frequent menstruation with regular cycle: Secondary | ICD-10-CM | POA: Insufficient documentation

## 2022-05-25 DIAGNOSIS — Z882 Allergy status to sulfonamides status: Secondary | ICD-10-CM | POA: Insufficient documentation

## 2022-05-25 DIAGNOSIS — R102 Pelvic and perineal pain: Secondary | ICD-10-CM | POA: Insufficient documentation

## 2022-05-25 NOTE — Telephone Encounter (Signed)
If she can come Monday morning (prior to 10am) and get labs, then she can keep her MD/ venofer infusion that day in the afternoon. That will allow enough time for labs to result

## 2022-05-25 NOTE — Telephone Encounter (Signed)
Patient called to see if she needs to have labs. She states she has had them done a couple oif times and wonders if those results can be used. She is having car trouble and can't come in today.

## 2022-05-25 NOTE — Telephone Encounter (Signed)
Spoke to pt and she is aware that she needs to have labs done prior to seeing MD / IV venofer.

## 2022-05-25 NOTE — Telephone Encounter (Signed)
Yes, will need labs redrawn in order to see if she needs IV iron.(last iron lab set was back in August). Please r/s appt per her availability.   Labs MD/ venofer 1-2 days after labs.

## 2022-05-28 ENCOUNTER — Inpatient Hospital Stay: Payer: BC Managed Care – PPO

## 2022-05-28 ENCOUNTER — Encounter: Payer: Self-pay | Admitting: Oncology

## 2022-05-28 ENCOUNTER — Inpatient Hospital Stay (HOSPITAL_BASED_OUTPATIENT_CLINIC_OR_DEPARTMENT_OTHER): Payer: BC Managed Care – PPO | Admitting: Oncology

## 2022-05-28 VITALS — BP 110/66 | HR 67 | Resp 16

## 2022-05-28 VITALS — BP 122/87 | HR 77 | Temp 98.4°F | Resp 18 | Wt 229.5 lb

## 2022-05-28 DIAGNOSIS — Z888 Allergy status to other drugs, medicaments and biological substances status: Secondary | ICD-10-CM | POA: Diagnosis not present

## 2022-05-28 DIAGNOSIS — N920 Excessive and frequent menstruation with regular cycle: Secondary | ICD-10-CM | POA: Diagnosis not present

## 2022-05-28 DIAGNOSIS — Z79899 Other long term (current) drug therapy: Secondary | ICD-10-CM | POA: Diagnosis not present

## 2022-05-28 DIAGNOSIS — R5383 Other fatigue: Secondary | ICD-10-CM | POA: Diagnosis not present

## 2022-05-28 DIAGNOSIS — R102 Pelvic and perineal pain: Secondary | ICD-10-CM | POA: Diagnosis not present

## 2022-05-28 DIAGNOSIS — D649 Anemia, unspecified: Secondary | ICD-10-CM

## 2022-05-28 DIAGNOSIS — Z881 Allergy status to other antibiotic agents status: Secondary | ICD-10-CM | POA: Diagnosis not present

## 2022-05-28 DIAGNOSIS — Z882 Allergy status to sulfonamides status: Secondary | ICD-10-CM | POA: Diagnosis not present

## 2022-05-28 DIAGNOSIS — R0602 Shortness of breath: Secondary | ICD-10-CM | POA: Diagnosis not present

## 2022-05-28 DIAGNOSIS — R002 Palpitations: Secondary | ICD-10-CM | POA: Diagnosis not present

## 2022-05-28 DIAGNOSIS — R5382 Chronic fatigue, unspecified: Secondary | ICD-10-CM | POA: Diagnosis not present

## 2022-05-28 LAB — IRON AND TIBC
Iron: 62 ug/dL (ref 28–170)
Saturation Ratios: 18 % (ref 10.4–31.8)
TIBC: 349 ug/dL (ref 250–450)
UIBC: 287 ug/dL

## 2022-05-28 LAB — CBC WITH DIFFERENTIAL/PLATELET
Abs Immature Granulocytes: 0.01 10*3/uL (ref 0.00–0.07)
Basophils Absolute: 0 10*3/uL (ref 0.0–0.1)
Basophils Relative: 0 %
Eosinophils Absolute: 0 10*3/uL (ref 0.0–0.5)
Eosinophils Relative: 1 %
HCT: 38.3 % (ref 36.0–46.0)
Hemoglobin: 11.9 g/dL — ABNORMAL LOW (ref 12.0–15.0)
Immature Granulocytes: 0 %
Lymphocytes Relative: 24 %
Lymphs Abs: 1.4 10*3/uL (ref 0.7–4.0)
MCH: 25.3 pg — ABNORMAL LOW (ref 26.0–34.0)
MCHC: 31.1 g/dL (ref 30.0–36.0)
MCV: 81.5 fL (ref 80.0–100.0)
Monocytes Absolute: 0.3 10*3/uL (ref 0.1–1.0)
Monocytes Relative: 6 %
Neutro Abs: 4 10*3/uL (ref 1.7–7.7)
Neutrophils Relative %: 69 %
Platelets: 311 10*3/uL (ref 150–400)
RBC: 4.7 MIL/uL (ref 3.87–5.11)
RDW: 14.1 % (ref 11.5–15.5)
WBC: 5.7 10*3/uL (ref 4.0–10.5)
nRBC: 0 % (ref 0.0–0.2)

## 2022-05-28 LAB — PROTIME-INR
INR: 1 (ref 0.8–1.2)
Prothrombin Time: 13 seconds (ref 11.4–15.2)

## 2022-05-28 LAB — RETIC PANEL
Immature Retic Fract: 5.6 % (ref 2.3–15.9)
RBC.: 4.59 MIL/uL (ref 3.87–5.11)
Retic Count, Absolute: 49.1 10*3/uL (ref 19.0–186.0)
Retic Ct Pct: 1.1 % (ref 0.4–3.1)
Reticulocyte Hemoglobin: 27.3 pg — ABNORMAL LOW (ref >27.9)

## 2022-05-28 LAB — FERRITIN: Ferritin: 35 ng/mL (ref 11–307)

## 2022-05-28 LAB — APTT: aPTT: 30 seconds (ref 24–36)

## 2022-05-28 MED ORDER — SODIUM CHLORIDE 0.9 % IV SOLN
Freq: Once | INTRAVENOUS | Status: AC
  Filled 2022-05-28: qty 250

## 2022-05-28 MED ORDER — SODIUM CHLORIDE 0.9 % IV SOLN
200.0000 mg | Freq: Once | INTRAVENOUS | Status: AC
  Administered 2022-05-28: 200 mg via INTRAVENOUS
  Filled 2022-05-28: qty 200

## 2022-05-28 NOTE — Progress Notes (Unsigned)
Patient here for follow up. Pt concerned about low blood count.

## 2022-05-28 NOTE — Patient Instructions (Signed)

## 2022-05-29 ENCOUNTER — Encounter: Payer: Self-pay | Admitting: Oncology

## 2022-05-29 DIAGNOSIS — N920 Excessive and frequent menstruation with regular cycle: Secondary | ICD-10-CM | POA: Insufficient documentation

## 2022-05-29 LAB — COAG STUDIES INTERP REPORT

## 2022-05-29 LAB — VON WILLEBRAND PANEL
Coagulation Factor VIII: 155 % — ABNORMAL HIGH (ref 56–140)
Ristocetin Co-factor, Plasma: 93 % (ref 50–200)
Von Willebrand Antigen, Plasma: 139 % (ref 50–200)

## 2022-05-29 NOTE — Assessment & Plan Note (Addendum)
Chronic fatigue, palpitation, shortness of breath With her hemoglobin at 11.9,  fatigue/shortness of breath/palpitation symptoms appear to be out of proportion to her hemoglobin levels.   I suspect that anxiety other mood disorders may contribute to her symptoms I recommend patient referral discussed with primary care provider for work-up of other etiologies of fatigue.

## 2022-05-29 NOTE — Assessment & Plan Note (Addendum)
I recommend patient to further discuss with her Gyn.  Normal PT, PTT, VWF panel normal.

## 2022-05-29 NOTE — Assessment & Plan Note (Addendum)
Chronic normocytic anemia Last hemoglobin count at [facility] was 11.6, wnl.  Previous iron panel in September 2023 showed borderline iron saturation 11, normal ferritin.  I have recommended patient to take oral iron supplementation which she is not able to tolerate.  I recommend to repeat her iron levels and cbc today to re-evaluate iron store.  She agrees with lab work up however she adamantly wants to proceed with IV venofer without waiting for iron panel to results.  We had lengthy discussion. She understands that it is not ideal to receive IV Venofer without knowing her current iron level. She complains severe fatigue, cold hands and feet, heavy menstrual period,  and feels frustrated that no other doctors have told her why, and no one has helped her.  She insists to receive one dose of IV Venofer today without waiting for her iron panel results.  We discussed about the rationale and side effects of IV Venofer treatments. She agrees and received one dose of Venofer.  Her labs come back, hemoglobin is 11.9, very mild anemia, iron panel showed normal ferritin 35, iron saturation of 18. Mild low reticulocyte hemoglobin level. She has now received one dose of IV venofer which should further improve her iron panel.   I recommend to repeat her blood work in 4 weeks. Will not offer additional IV Venofer given that her iron levels is normal even before Venofer.

## 2022-05-29 NOTE — Progress Notes (Signed)
Hematology/Oncology Consult note [phone] [phone]      Patient Care Team: [facility] as PCP - General (Family Medicine)   REFERRING PROVIDER: [facility]  CHIEF COMPLAINTS/REASON FOR VISIT:  Anemia  ASSESSMENT & PLAN:   Normocytic anemia Chronic normocytic anemia Last hemoglobin count at [facility] was 11.6, wnl.  Previous iron panel in September 2023 showed borderline iron saturation 11, normal ferritin.  I have recommended patient to take oral iron supplementation which she is not able to tolerate.  I recommend to repeat her iron levels and cbc today to re-evaluate iron store.  She agrees with lab work up however she adamantly wants to proceed with IV venofer without waiting for iron panel to results.  We had lengthy discussion. She understands that it is not ideal to receive IV Venofer without knowing her current iron level. She complains severe fatigue, cold hands and feet, heavy menstrual period,  and feels frustrated that no other doctors have told her why, and no one has helped her.  She insists to receive one dose of IV Venofer today without waiting for her iron panel results.  We discussed about the rationale and side effects of IV Venofer treatments. She agrees and received one dose of Venofer.  Her labs come back, hemoglobin is 11.9, very mild anemia, iron panel showed normal ferritin 35, iron saturation of 18. Mild low reticulocyte hemoglobin level. She has now received one dose of IV venofer which should further improve her iron panel.   I recommend to repeat her blood work in 4 weeks. Will not offer additional IV Venofer given that her iron levels is normal even before Venofer.    Fatigue Chronic fatigue, palpitation, shortness of breath With her hemoglobin at 11.9,  fatigue/shortness of breath/palpitation symptoms appear to be out of proportion to her hemoglobin levels.   I suspect that anxiety other mood  disorders may contribute to her symptoms I recommend patient referral discussed with primary care provider for work-up of other etiologies of fatigue.  Menorrhagia I recommend patient to further discuss with her Gyn.  Normal PT, PTT, VWF panel normal.    Orders Placed This Encounter  Procedures   Ferritin    Standing Status:   Future    Number of Occurrences:   1    Standing Expiration Date:   11/27/2022   Iron and TIBC    Standing Status:   Future    Number of Occurrences:   1    Standing Expiration Date:   05/29/2023   CBC with Differential/Platelet    Standing Status:   Future    Number of Occurrences:   1    Standing Expiration Date:   05/29/2023   Von Willebrand panel    Standing Status:   Future    Number of Occurrences:   1    Standing Expiration Date:   05/29/2023   APTT    Standing Status:   Future    Number of Occurrences:   1    Standing Expiration Date:   05/29/2023   Protime-INR    Standing Status:   Future    Number of Occurrences:   1    Standing Expiration Date:   05/29/2023   Retic Panel    Standing Status:   Future    Number of Occurrences:   1    Standing Expiration Date:   05/29/2023   Follow-up PRN Repeat blood work in 4 weeks.   We  spent sufficient time to discuss many aspect of care, questions were answered to patient's satisfaction.patient and husband had multiple questions, All questions were answered. The patient knows to call the clinic with any problems, questions or concerns  A total of 40 minutes was spent on this visit.  With 5 minutes spent reviewing previous lab findings, 30 minutes counseling the patient on history intake, lab explanation, and my assessment/plan, side effects of treatments.   Additional 5 minutes was spent on answering patient's questions.  Rickard Patience.  Kenneth Lax, MD, PhD [facility] 05/28/2022     INTERVAL HISTORY Kara Collins is a 26 y.o. female who has above history reviewed by me today presents for  follow up visit for anemia.  Patient was seen by me on 01/15/2022.  At that time she had borderline iron saturation 11, normal ferritin, hemoglobin 10.9.  Recommend patient to take oral iron supplementation and follow-up in 3 months. Patient no showed for her appointment in November 2023. Patient called on 05/23/2022 requesting iron infusion and was scheduled to reestablish care with me. She was accompanied by her husband. She has ongoing chronic fatigue, cold sensation, shortness of breath with heart palpitation, brain fog.  She has seen gynecology Patient reports that she had a syncope and [facility].  Her hemoglobin was 11.6, [reference 11.3-14.9] D-dimer was negative, COVID flu RSV negative.  Electrolytes were unremarkable.  She was discharged home. She reports heavy menstrual period.  She sees [facility] Gelene MinkMoore Tanya, patient complains fatigue, brain fog, pelvic pain are worse prior to the menstrual period and during period. Normal pelvic ultrasound in July 2023. Gyn felt that she may have PMS and also discussed that anxiety or depression could contribute to her symptoms.  She was recommended to start OCP which she was reluctant to start.  She was also recommended to get pelvic floor physical therapy. She was also seen by endocrinology for dysmenorrhea.   MEDICAL HISTORY:  Past Medical History:  Diagnosis Date   Abortion    Anemia    Ankle fracture 01/2021   Asthma    Hematoma 01/2021   abdominal    SURGICAL HISTORY: Past Surgical History:  Procedure Laterality Date   CESAREAN SECTION     DILATION AND CURETTAGE OF UTERUS      SOCIAL HISTORY: Social History   Socioeconomic History   Marital status: Married    Spouse name: Not on file   Number of children: Not on file   Years of education: Not on file   Highest education level: Not on file  Occupational History   Not on file  Tobacco Use   Smoking status: Never   Smokeless tobacco:  Never  Vaping Use   Vaping Use: Never used  Substance and Sexual Activity   Alcohol use: Never   Drug use: Never   Sexual activity: Yes    Birth control/protection: Condom  Other Topics Concern   Not on file  Social History Narrative   Not on file   Social Determinants of Health   Financial Resource Strain: Not on file  Food Insecurity: Not on file  Transportation Needs: Not on file  Physical Activity: Not on file  Stress: Not on file  Social Connections: Not on file  Intimate Partner Violence: Not on file    FAMILY HISTORY: Family History  Problem Relation Age of Onset   Other Mother    Healthy Father     ALLERGIES:  is  allergic to cephalexin, pollen extract, sulfa antibiotics, and zofran [ondansetron hcl].  MEDICATIONS:  Current Outpatient Medications  Medication Sig Dispense Refill   albuterol (VENTOLIN HFA) 108 (90 Base) MCG/ACT inhaler Inhale 2 puffs into the lungs every 4 (four) hours as needed for wheezing or shortness of breath. 18 g 0   Ferrous Sulfate 142 (45 Fe) MG TBCR Take 1 tablet by mouth daily. 90 tablet 1   Multiple Vitamin (MULTI-VITAMIN) tablet Take 1 tablet by mouth daily.     fluconazole (DIFLUCAN) 10 mg/mL SUSP Take by mouth. (Patient not taking: Reported on 01/15/2022)     MOUNJARO 2.5 MG/0.5ML Pen SMARTSIG:2.5 Milligram(s) SUB-Q Once a Week (Patient not taking: Reported on 05/28/2022)     No current facility-administered medications for this visit.    Review of Systems  Constitutional:  Positive for fatigue. Negative for appetite change, chills and fever.  HENT:   Negative for hearing loss and voice change.   Eyes:  Negative for eye problems.  Respiratory:  Positive for shortness of breath. Negative for chest tightness and cough.   Cardiovascular:  Positive for palpitations. Negative for chest pain.  Gastrointestinal:  Negative for abdominal distention, abdominal pain and blood in stool.  Endocrine: Negative for hot flashes.  Genitourinary:   Positive for menstrual problem. Negative for difficulty urinating and frequency.   Musculoskeletal:  Negative for arthralgias.  Skin:  Negative for itching and rash.  Neurological:  Negative for extremity weakness.  Hematological:  Negative for adenopathy.  Psychiatric/Behavioral:  Negative for confusion.     PHYSICAL EXAMINATION: ECOG PERFORMANCE STATUS: 1 - Symptomatic but completely ambulatory Vitals:   05/28/22 1416  BP: 122/87  Pulse: 77  Resp: 18  Temp: 98.4 F (36.9 C)   Filed Weights   05/28/22 1416  Weight: 229 lb 8 oz (104.1 kg)    Physical Exam Constitutional:      General: She is not in acute distress. HENT:     Head: Normocephalic and atraumatic.  Eyes:     General: No scleral icterus. Cardiovascular:     Rate and Rhythm: Normal rate and regular rhythm.     Heart sounds: Normal heart sounds.  Pulmonary:     Effort: Pulmonary effort is normal. No respiratory distress.     Breath sounds: No wheezing.  Abdominal:     General: Bowel sounds are normal. There is no distension.     Palpations: Abdomen is soft.  Musculoskeletal:        General: No deformity. Normal range of motion.     Cervical back: Normal range of motion and neck supple.  Skin:    General: Skin is warm and dry.     Findings: No erythema or rash.  Neurological:     Mental Status: She is alert and oriented to person, place, and time. Mental status is at baseline.     Cranial Nerves: No cranial nerve deficit.     Coordination: Coordination normal.  Psychiatric:        Mood and Affect: Mood normal.      LABORATORY DATA:  I have reviewed the data as listed    Latest Ref Rng & Units 05/28/2022    3:30 PM 01/15/2022   12:01 PM 06/03/2020    7:13 PM  CBC  WBC 4.0 - 10.5 K/uL 5.7  4.0  6.1   Hemoglobin 12.0 - 15.0 g/dL 17.4  08.1  44.8   Hematocrit 36.0 - 46.0 % 38.3  35.0  35.3   Platelets  150 - 400 K/uL 311  303  307       Latest Ref Rng & Units 01/15/2022   12:01 PM 06/03/2020     7:13 PM 01/31/2020   12:44 PM  CMP  Glucose 70 - 99 mg/dL 90  94  88   BUN 6 - 20 mg/dL 10  11  8    Creatinine 0.44 - 1.00 mg/dL  6.60  6.30   Sodium 135 - 145 mmol/L 135  137  141   Potassium 3.5 - 5.1 mmol/L 3.9  4.3  4.1   Chloride 98 - 111 mmol/L 103  105  106   CO2 22 - 32 mmol/L 25  22  23    Calcium 8.9 - 10.3 mg/dL 8.6  9.0  9.5   Total Protein 6.5 - 8.1 g/dL 8.0  7.9    Total Bilirubin 0.3 - 1.2 mg/dL 0.3  0.1    Alkaline Phos 38 - 126 U/L 54  51    AST 15 - 41 U/L 20  21    ALT 0 - 44 U/L 12  17        Component Value Date/Time   IRON 62 05/28/2022 1530   TIBC 349 05/28/2022 1530   FERRITIN 35 05/28/2022 1530   IRONPCTSAT 18 05/28/2022 1530     RADIOGRAPHIC STUDIES: I have personally reviewed the radiological images as listed and agreed with the findings in the report. No results found.

## 2022-05-30 ENCOUNTER — Telehealth: Payer: Self-pay

## 2022-05-30 NOTE — Telephone Encounter (Signed)
-----   Message from Rickard Patience, MD sent at 05/29/2022  7:16 PM EST ----- Please let her know that her blood work prior to her empiric iron infusion showed hemoglobin of 11.9, normal iron level.  I recommend her to repeat blood work in 4 weeks. If her count is low, I will arrange her to do more iron infusions. If count is normal, then no follow up /additional iron infusion is needed.

## 2022-05-30 NOTE — Telephone Encounter (Signed)
Mychart message sent to pt to inform her of follow up plan.   Please schedule and inform pt of appt:   Labs in 4 weeks

## 2022-05-31 ENCOUNTER — Encounter: Payer: Self-pay | Admitting: Oncology

## 2022-05-31 ENCOUNTER — Telehealth: Payer: Self-pay | Admitting: Oncology

## 2022-05-31 NOTE — Telephone Encounter (Signed)
Patient and husband called and wants additional discussion.  All her labs were discussed in details. Her hemoglobin is 11.9, close to normal level, iron panel is not consistent with severe iron deficiency, very mildly decreased retic hemoglobin, decreased MCH.  She has received an empiric IV venofer after her current blood work and I do not recommend additional IV venofer treatment for now. I recommend repeating CBC and iron panel in 4 weeks.  No VWF disease. Slightly elevated factor VIII level likely is reactive, and is not consistent with her menorrhagia. I recommend her to further discuss with her PCP and Gyn regarding her symptoms.

## 2022-05-31 NOTE — Telephone Encounter (Signed)
Dr. Cathie Hoops has talked to pt

## 2022-06-27 ENCOUNTER — Inpatient Hospital Stay: Payer: BC Managed Care – PPO | Attending: Oncology

## 2022-08-22 ENCOUNTER — Encounter: Payer: Self-pay | Admitting: Emergency Medicine

## 2022-08-22 ENCOUNTER — Ambulatory Visit
Admission: EM | Admit: 2022-08-22 | Discharge: 2022-08-22 | Disposition: A | Discharge status: Home / Self Care | Payer: BC Managed Care – PPO | Attending: Family Medicine | Admitting: Family Medicine

## 2022-08-22 DIAGNOSIS — R21 Rash and other nonspecific skin eruption: Secondary | ICD-10-CM | POA: Diagnosis not present

## 2022-08-22 MED ORDER — NYSTATIN 100000 UNIT/ML MT SUSP: 500000.0000 [IU] | Freq: Four times a day (QID) | OROMUCOSAL | 0 refills | Status: DC

## 2022-08-22 MED ORDER — TRIAMCINOLONE ACETONIDE 0.1 % EX OINT: 1.0000 | TOPICAL_OINTMENT | Freq: Two times a day (BID) | CUTANEOUS | 0 refills | Status: DC

## 2022-08-22 NOTE — Discharge Instructions (Signed)
Your rash appears to be moisture related injury.  We will need to keep the skin dry and heal it at the same time. Stop by the pharmacy to pick up your prescriptions.  Follow up with your primary care provider as needed.

## 2022-08-22 NOTE — ED Provider Notes (Signed)
[facility]    CSN: [id number] Arrival date & time: 08/22/22  [id number]      History   Chief Complaint Chief Complaint  Patient presents with   Rash    HPI Kara Collins is a 27 y.o. female.   HPI  Kara Collins presents for rash under both arms after changing the deodorant that started about a week ago.  She has been sweating more under her arms.  She noticed some swelling on the right side that hurts. She googled her symptoms and is concerned for possible cancer.   She has no known family history of breast cancer. She has otherwise been in her normal state of health.    Past Medical History:  Diagnosis Date   Abortion    Anemia    Ankle fracture 01/2021   Asthma    Hematoma 01/2021   abdominal    Patient Active Problem List   Diagnosis Date Noted   Menorrhagia 05/29/2022   Normocytic anemia 01/15/2022   Raynaud's phenomenon without gangrene 01/15/2022   Hypocalcemia 01/15/2022   Fatigue 01/15/2022    Past Surgical History:  Procedure Laterality Date   CESAREAN SECTION     DILATION AND CURETTAGE OF UTERUS      OB History     Gravida  1   Para      Term      Preterm      AB      Living         SAB      IAB      Ectopic      Multiple      Live Births               Home Medications    Prior to Admission medications   Medication Sig Start Date End Date Taking? Authorizing Provider  Ferrous Sulfate 142 (45 Fe) MG TBCR Take 1 tablet by mouth daily. 01/15/22  Yes Earlie Server, MD  Multiple Vitamin (MULTI-VITAMIN) tablet Take 1 tablet by mouth daily.   Yes [provider]  nystatin (MYCOSTATIN) 100000 UNIT/ML suspension Take 5 mLs (500,000 Units total) by mouth 4 (four) times daily. 08/22/22  Yes Macarena Langseth, DO  triamcinolone ointment (KENALOG) 0.1 % Apply 1 Application topically 2 (two) times daily. 08/22/22  Yes Memorie Yokoyama, Ronnette Juniper, DO  Vitamin D, Ergocalciferol, (DRISDOL) 1.25 MG (50000 UNIT) CAPS capsule Take 50,000 Units by  mouth once a week. 07/27/22  Yes [provider]  albuterol (VENTOLIN HFA) 108 (90 Base) MCG/ACT inhaler Inhale 2 puffs into the lungs every 4 (four) hours as needed for wheezing or shortness of breath. 09/27/19   Faustino Congress, NP  fluconazole (DIFLUCAN) 10 mg/mL SUSP Take by mouth. Patient not taking: Reported on 01/15/2022    [provider]  MOUNJARO 2.5 MG/0.5ML Pen SMARTSIG:2.5 Milligram(s) SUB-Q Once a Week Patient not taking: Reported on 05/28/2022 01/04/22   [provider]    Family History Family History  Problem Relation Age of Onset   Other Mother    Healthy Father     Social History Social History   Tobacco Use   Smoking status: Never   Smokeless tobacco: Never  Vaping Use   Vaping Use: Never used  Substance Use Topics   Alcohol use: Never   Drug use: Never     Allergies   Aspirin, Cephalexin, Pollen extract, Sulfa antibiotics, and Zofran [ondansetron hcl]   Review of Systems Review of Systems :negative unless otherwise stated in HPI.  Physical Exam Triage Vital Signs ED Triage Vitals  Enc Vitals Group     BP 08/22/22 1017 102/68     Pulse Rate 08/22/22 1017 70     Resp 08/22/22 1017 18     Temp 08/22/22 1017 98.4 F (36.9 C)     Temp Source 08/22/22 1017 Oral     SpO2 08/22/22 1017 100 %     Weight --      Height --      Head Circumference --      Peak Flow --      Pain Score 08/22/22 1014 0     Pain Loc --      Pain Edu? --      Excl. in [place]? --    No data found.  Updated Vital Signs BP 102/68 (BP Location: Right Arm)   Pulse 70   Temp 98.4 F (36.9 C) (Oral)   Resp 18   LMP 07/28/2022   SpO2 100%   Visual Acuity Right Eye Distance:   Left Eye Distance:   Bilateral Distance:    Right Eye Near:   Left Eye Near:    Bilateral Near:     Physical Exam  GEN: alert, well appearing female, in no acute distress  EYES: no scleral injection or discharge  CV: regular rate  RESP: no increased work of  breathing MSK: normal ROM of bilateral shoulders, no edema in upper extremities, 1 cm palpable mass in right axilla that is tender to touch NEURO: alert, moves all extremities appropriately PSYCH: Normal affect, appropriate speech and behavior  SKIN: warm and dry; bilateral axilla with hyperpigmented erythematous patch in the upper skin fold    UC Treatments / Results  Labs (all labs ordered are listed, but only abnormal results are displayed) Labs Reviewed - No data to display  EKG   Radiology No results found.  Procedures Procedures (including critical care time)  Medications Ordered in UC Medications - No data to display  Initial Impression / Assessment and Plan / UC Course  I have reviewed the triage vital signs and the nursing notes.  Pertinent labs & imaging results that were available during my care of the patient were reviewed by me and considered in my medical decision making (see chart for details).     Patient is a 27 y.o. femalewho presents for 1 week of armpit rash.  Overall, patient is well-appearing and well-hydrated.  Vital signs stable.  Kara Collins is afebrile.  Exam concerning for fungal infection vs contact dermatitis.  Treat with steroid ointment and Nystatin powder.  No sign of infection to suggest antibiotics at this time.     Reviewed expectations regarding course of current medical issues.  All questions asked were answered.  Outlined signs and symptoms indicating need for more acute intervention. Patient verbalized understanding. After Visit Summary given.   Final Clinical Impressions(s) / UC Diagnoses   Final diagnoses:  Rash     Discharge Instructions      Your rash appears to be moisture related injury.  We will need to keep the skin dry and heal it at the same time. Stop by the pharmacy to pick up your prescriptions.  Follow up with your primary care provider as needed.      ED Prescriptions     Medication Sig Dispense Auth. Provider    triamcinolone ointment (KENALOG) 0.1 % Apply 1 Application topically 2 (two) times daily. 15 g Lannie Yusuf, DO   nystatin (MYCOSTATIN) 100000 UNIT/ML  suspension Take 5 mLs (500,000 Units total) by mouth 4 (four) times daily. 60 mL Lyndee Hensen, DO      PDMP not reviewed this encounter.              Lyndee Hensen, DO 08/22/22 1144

## 2022-08-22 NOTE — ED Triage Notes (Signed)
Pt presents with bilateral underarm rash and lumps x 1 week. Pt reports trying a new deodorant.

## 2022-08-25 ENCOUNTER — Telehealth: Payer: Self-pay | Admitting: Family Medicine

## 2022-08-25 MED ORDER — NYSTATIN 100000 UNIT/GM EX POWD: 1.0000 | Freq: Three times a day (TID) | CUTANEOUS | 0 refills | Status: DC

## 2022-08-25 NOTE — Telephone Encounter (Signed)
Patient picked up nystatin solution instead of the powder.  This is a medication error.  She has not yet used this medication. Apologized to patient. Switch to nystatin powder for underarm rash.   Lyndee Hensen, DO

## 2022-08-29 ENCOUNTER — Telehealth: Payer: Self-pay | Admitting: Oncology

## 2022-08-29 ENCOUNTER — Other Ambulatory Visit: Payer: Self-pay

## 2022-08-29 DIAGNOSIS — D649 Anemia, unspecified: Secondary | ICD-10-CM

## 2022-08-29 NOTE — Telephone Encounter (Signed)
Error

## 2022-08-29 NOTE — Telephone Encounter (Signed)
This patient called and would like to schedule appointment with Dr. Tasia Catchings. Please advise on scheduling Thank you

## 2022-09-05 ENCOUNTER — Ambulatory Visit
Admission: EM | Admit: 2022-09-05 | Discharge: 2022-09-05 | Disposition: A | Discharge status: Home / Self Care | Payer: BC Managed Care – PPO | Attending: Emergency Medicine | Admitting: Emergency Medicine

## 2022-09-05 DIAGNOSIS — H538 Other visual disturbances: Secondary | ICD-10-CM

## 2022-09-05 DIAGNOSIS — R21 Rash and other nonspecific skin eruption: Secondary | ICD-10-CM

## 2022-09-05 MED ORDER — CLOTRIMAZOLE-BETAMETHASONE 1-0.05 % EX CREA: TOPICAL_CREAM | CUTANEOUS | 0 refills | Status: DC

## 2022-09-05 NOTE — Discharge Instructions (Addendum)
You ned to make an appointment with [facility] to have your vision checked and to determine the source of your blurry vision.  For the rash on yoru neck and chest apply the Lotrisone cream twice daily for 7 days to see if yor symptoms improve.  Avoid sun exposure- wear sunscreen.  Keep the area open to air as much as possible.  You can use OTC Allegra, Zyrtec, or Claritin for itching during the day and Benadryl at bedtime.  If your symptoms do not improve either return for re-evaluation or follow up with dermatology.

## 2022-09-05 NOTE — ED Provider Notes (Signed)
[facility]    CSN: [id number] Arrival date & time: 09/05/22  0816      History   Chief Complaint Chief Complaint  Patient presents with   Rash   Blurred Vision    HPI Kara Collins is a 27 y.o. female.   HPI  27 year old female with a past medical history significant for asthma, anemia, and Raynaud's phenomenon presenting for evaluation of blurry vision in her left eye for 6 days and rash on her chest and neck for 4 days.  She denies any eye pain, discharge, itching, or redness.  She does have eyeglasses but they were last prescribed 2 years ago and she does not actively see an eye doctor. Patient second complaint is a red itchy rash that its on her dcolletage and neck.  It has been there for 4 days.  She denies any new personal hygiene products, laundry detergent, clothing, medications, or supplements.  She states that she was recently at [facility] and exposed to flowers as well as at [employer].  It does itch but there is no greasy coating or flaky skin.  The area is red.  Past Medical History:  Diagnosis Date   Abortion    Anemia    Ankle fracture 01/2021   Asthma    Hematoma 01/2021   abdominal    Patient Active Problem List   Diagnosis Date Noted   Menorrhagia 05/29/2022   Normocytic anemia 01/15/2022   Raynaud's phenomenon without gangrene 01/15/2022   Hypocalcemia 01/15/2022   Fatigue 01/15/2022    Past Surgical History:  Procedure Laterality Date   CESAREAN SECTION     DILATION AND CURETTAGE OF UTERUS      OB History     Gravida  1   Para      Term      Preterm      AB      Living         SAB      IAB      Ectopic      Multiple      Live Births               Home Medications    Prior to Admission medications   Medication Sig Start Date End Date Taking? Authorizing Provider  clotrimazole-betamethasone (LOTRISONE) cream Apply to affected area 2 times daily prn 09/05/22  Yes Margarette Canada, NP  albuterol  (VENTOLIN HFA) 108 (90 Base) MCG/ACT inhaler Inhale 2 puffs into the lungs every 4 (four) hours as needed for wheezing or shortness of breath. 09/27/19   Faustino Congress, NP  Ferrous Sulfate 142 (45 Fe) MG TBCR Take 1 tablet by mouth daily. 01/15/22   Earlie Server, MD  fluconazole (DIFLUCAN) 10 mg/mL SUSP Take by mouth. Patient not taking: Reported on 01/15/2022    [provider]  MOUNJARO 2.5 MG/0.5ML Pen SMARTSIG:2.5 Milligram(s) SUB-Q Once a Week Patient not taking: Reported on 05/28/2022 01/04/22   [provider]  Multiple Vitamin (MULTI-VITAMIN) tablet Take 1 tablet by mouth daily.    [provider]  nystatin (MYCOSTATIN/NYSTOP) powder Apply 1 Application topically 3 (three) times daily. 08/25/22   Lyndee Hensen, DO  triamcinolone ointment (KENALOG) 0.1 % Apply 1 Application topically 2 (two) times daily. 08/22/22   Lyndee Hensen, DO  Vitamin D, Ergocalciferol, (DRISDOL) 1.25 MG (50000 UNIT) CAPS capsule Take 50,000 Units by mouth once a week. 07/27/22   [provider]    Family History Family History  Problem Relation Age of Onset   Other Mother    Healthy Father     Social History Social History   Tobacco Use   Smoking status: Never   Smokeless tobacco: Never  Vaping Use   Vaping Use: Never used  Substance Use Topics   Alcohol use: Never   Drug use: Never     Allergies   Aspirin, Cephalexin, Pollen extract, Sulfa antibiotics, and Zofran [ondansetron hcl]   Review of Systems Review of Systems  HENT:  Negative for trouble swallowing.   Eyes:  Positive for visual disturbance. Negative for photophobia, pain, discharge, redness and itching.  Respiratory:  Negative for shortness of breath.   Skin:  Positive for rash.     Physical Exam Triage Vital Signs ED Triage Vitals  Enc Vitals Group     BP 09/05/22 0909 110/72     Pulse Rate 09/05/22 0909 73     Resp 09/05/22 0909 16     Temp 09/05/22 0909 98.5 F (36.9 C)     Temp Source  09/05/22 0909 Oral     SpO2 09/05/22 0909 100 %     Weight --      Height --      Head Circumference --      Peak Flow --      Pain Score 09/05/22 0912 0     Pain Loc --      Pain Edu? --      Excl. in [place]? --    No data found.  Updated Vital Signs BP 110/72 (BP Location: Left Arm)   Pulse 73   Temp 98.5 F (36.9 C) (Oral)   Resp 16   LMP 07/28/2022   SpO2 100%   Breastfeeding No   Visual Acuity Right Eye Distance: 20/20 Left Eye Distance: 20/20 Bilateral Distance: 20/20  Right Eye Near:   Left Eye Near:    Bilateral Near:     Physical Exam Vitals and nursing note reviewed.  Constitutional:      Appearance: Normal appearance. She is not ill-appearing.  HENT:     Head: Normocephalic and atraumatic.  Eyes:     General: No scleral icterus.       Right eye: No discharge.        Left eye: No discharge.     Extraocular Movements: Extraocular movements intact.     Conjunctiva/sclera: Conjunctivae normal.     Pupils: Pupils are equal, round, and reactive to light.     Comments: Patient's left eye has normal red light reflex and the pupils equal round reactive.  EOM's intact.  Cardiovascular:     Rate and Rhythm: Normal rate and regular rhythm.     Pulses: Normal pulses.     Heart sounds: Normal heart sounds. No murmur heard.    No friction rub. No gallop.  Pulmonary:     Effort: Pulmonary effort is normal.     Breath sounds: Normal breath sounds. No stridor. No wheezing, rhonchi or rales.  Musculoskeletal:     Cervical back: Normal range of motion and neck supple.  Skin:    General: Skin is warm and dry.     Capillary Refill: Capillary refill takes less than 2 seconds.     Findings: Erythema and rash present.  Neurological:     Mental Status: She is alert.      UC Treatments / Results  Labs (all labs ordered are listed, but only abnormal results are displayed) Labs Reviewed - No data  to display  EKG   Radiology No results  found.  Procedures Procedures (including critical care time)  Medications Ordered in UC Medications - No data to display  Initial Impression / Assessment and Plan / UC Course  I have reviewed the triage vital signs and the nursing notes.  Pertinent labs & imaging results that were available during my care of the patient were reviewed by me and considered in my medical decision making (see chart for details).   Patient is a pleasant, nontoxic-appearing 27 year old female here for evaluation of 2 separate complaints as outlined in HPI above.  Her first complaint is a itchy red rash on her dcolletage and neck that began 4 days ago.  As you can see in the image above the redness is largely across her dcolletage and it is not raised or warm.  It does blanch.  There is some extension up into the anterior and posterior neck.  Patient has had fungal infection under both arms in the past but she says it was a different presentation.  Etiology of this rash is unclear but I will treat her for possible fungal source with Lotrisone cream twice daily for a week.  I have advised her that if her symptoms not improve she needs signs return for reevaluation or follow-up with a dermatologist and she has been given contact information for [place].  For the patient's blurry vision I have recommended that she see ophthalmology and have given her the contact information for [facility].  On exam her visual acuity shows OU 20/20, OD 20/20, OS 20/20 in the left eye has a normal red light reflex, pupils equal round reactive, and EOMs intact.  Some possibilities of blurry vision are that she needs a new prescription or she could possibly be developing a cataract but she needs a slit-lamp exam for further evaluation.  Given that she is not having any pain and has normal pupillary response I do not suspect that this is acute angle-closure.  She also has normal visual acuity and vision which makes central  retinal artery occlusion unlikely.  Final Clinical Impressions(s) / UC Diagnoses   Final diagnoses:  Rash and nonspecific skin eruption  Blurry vision, left eye     Discharge Instructions      You ned to make an appointment with [facility] to have your vision checked and to determine the source of your blurry vision.  For the rash on yoru neck and chest apply the Lotrisone cream twice daily for 7 days to see if yor symptoms improve.  Avoid sun exposure- wear sunscreen.  Keep the area open to air as much as possible.  You can use OTC Allegra, Zyrtec, or Claritin for itching during the day and Benadryl at bedtime.  If your symptoms do not improve either return for re-evaluation or follow up with dermatology.     ED Prescriptions     Medication Sig Dispense Auth. Provider   clotrimazole-betamethasone (LOTRISONE) cream Apply to affected area 2 times daily prn 45 g Margarette Canada, NP      PDMP not reviewed this encounter.   Margarette Canada, NP 09/05/22 [phone]

## 2022-09-05 NOTE — ED Triage Notes (Signed)
Patient also concerned with constant blurry vision to left eye since 6 days. States last eye exam was 2 years ago. Does not see an eye doctor.

## 2022-09-05 NOTE — ED Triage Notes (Signed)
Patient presents to UC for possible allergic rash since 4 days ago. States she used a new lotion. Treating with cortisone and benadryl with no changes.

## 2022-09-26 ENCOUNTER — Other Ambulatory Visit: Payer: Self-pay | Admitting: General Surgery

## 2022-09-26 DIAGNOSIS — K8020 Calculus of gallbladder without cholecystitis without obstruction: Secondary | ICD-10-CM

## 2022-10-02 MED FILL — Iron Sucrose Inj 20 MG/ML (Fe Equiv): INTRAVENOUS | Qty: 10 | Status: AC

## 2022-10-03 ENCOUNTER — Telehealth: Payer: Self-pay | Admitting: Oncology

## 2022-10-03 ENCOUNTER — Inpatient Hospital Stay: Payer: BC Managed Care – PPO

## 2022-10-03 ENCOUNTER — Other Ambulatory Visit: Payer: Self-pay

## 2022-10-03 ENCOUNTER — Inpatient Hospital Stay: Payer: BC Managed Care – PPO | Admitting: Oncology

## 2022-10-03 DIAGNOSIS — D649 Anemia, unspecified: Secondary | ICD-10-CM

## 2022-10-03 NOTE — Telephone Encounter (Signed)
Please schedule Labs D1 and MD/ blood or venofer D2.

## 2022-10-03 NOTE — Telephone Encounter (Signed)
Called patient to r/s missed appointment she states that she would prefer not to have IV iron and would like to be scheduled for Blood transfusion instead. I let her know that she would have to speak to the MD about that - we can not schedule without the order. I asked if it would be ok to reschedule the lab/md/infusion as she had scheduled today and then her speak to Dr. Cathie Hoops when she comes about the transfusion and she said ok.   I have rescheduled her appointments to 5/7 per her request.   Please let me know if she needs anything else. Thank you

## 2022-10-12 ENCOUNTER — Other Ambulatory Visit: Payer: BC Managed Care – PPO

## 2022-10-16 ENCOUNTER — Other Ambulatory Visit: Payer: BC Managed Care – PPO

## 2022-10-16 ENCOUNTER — Ambulatory Visit: Payer: BC Managed Care – PPO | Admitting: Oncology

## 2022-10-16 ENCOUNTER — Ambulatory Visit: Payer: BC Managed Care – PPO

## 2022-10-18 ENCOUNTER — Other Ambulatory Visit: Payer: BC Managed Care – PPO

## 2022-10-18 ENCOUNTER — Ambulatory Visit
Admission: RE | Admit: 2022-10-18 | Discharge: 2022-10-18 | Disposition: A | Discharge status: Home / Self Care | Payer: BC Managed Care – PPO | Source: Ambulatory Visit | Attending: General Surgery | Admitting: General Surgery

## 2022-10-18 ENCOUNTER — Inpatient Hospital Stay: Payer: BC Managed Care – PPO

## 2022-10-18 DIAGNOSIS — K8020 Calculus of gallbladder without cholecystitis without obstruction: Secondary | ICD-10-CM

## 2022-10-18 MED FILL — Iron Sucrose Inj 20 MG/ML (Fe Equiv): INTRAVENOUS | Qty: 10 | Status: AC

## 2022-10-19 ENCOUNTER — Ambulatory Visit: Payer: BC Managed Care – PPO

## 2022-10-19 ENCOUNTER — Inpatient Hospital Stay: Payer: BC Managed Care – PPO

## 2022-10-19 ENCOUNTER — Inpatient Hospital Stay: Payer: BC Managed Care – PPO | Attending: Oncology | Admitting: Oncology

## 2022-10-19 NOTE — Assessment & Plan Note (Deleted)
Chronic normocytic anemia Labs are reviewed and discussed with patient. Lab Results  Component Value Date   IRON 62 05/28/2022   TIBC 349 05/28/2022   IRONPCTSAT 18 05/28/2022   FERRITIN 35 05/28/2022      I recommend to repeat her blood work in 4 weeks. Will not offer additional IV Venofer given that her iron levels is normal even before Venofer.

## 2022-10-25 ENCOUNTER — Ambulatory Visit
Admission: EM | Admit: 2022-10-25 | Discharge: 2022-10-25 | Disposition: A | Discharge status: Home / Self Care | Payer: BC Managed Care – PPO | Attending: Emergency Medicine | Admitting: Emergency Medicine

## 2022-10-25 DIAGNOSIS — B372 Candidiasis of skin and nail: Secondary | ICD-10-CM

## 2022-10-25 MED ORDER — CLOTRIMAZOLE-BETAMETHASONE 1-0.05 % EX CREA: TOPICAL_CREAM | CUTANEOUS | 0 refills | Status: DC

## 2022-10-25 NOTE — Discharge Instructions (Signed)
Apply the Lotrisone cream twice daily until the rash has resolved and then for 2 additional days.  Stop using the deodorant that caused the irritation.  Keep the areas clean and dry. Leave them open to air as much as possible.  If your symptoms doe not improve return for re-evaluation or see a dermatologist.

## 2022-10-25 NOTE — ED Triage Notes (Signed)
Pt states she tried some new deodorant x couple weeks ago, started having some itching bilateral underarms, irritated, pt states underarms feel raw. Pt states she feels like they're swollen lymph nodes.

## 2022-10-25 NOTE — ED Provider Notes (Signed)
[facility]    CSN: [id number] Arrival date & time: 10/25/22  2952      History   Chief Complaint No chief complaint on file.   HPI Deslynn Maschino is a 27 y.o. female.   HPI  27 year old female with a past medical history significant for asthma and anemia presents for evaluation of bilateral axillary irritation.  After starting a new deodorant.  She states that her underarms feel raw and that her lymph nodes are swollen.  Past Medical History:  Diagnosis Date   Abortion    Anemia    Ankle fracture 01/2021   Asthma    Hematoma 01/2021   abdominal    Patient Active Problem List   Diagnosis Date Noted   Menorrhagia 05/29/2022   Normocytic anemia 01/15/2022   Raynaud's phenomenon without gangrene 01/15/2022   Hypocalcemia 01/15/2022   Fatigue 01/15/2022    Past Surgical History:  Procedure Laterality Date   CESAREAN SECTION     DILATION AND CURETTAGE OF UTERUS      OB History     Gravida  1   Para      Term      Preterm      AB      Living         SAB      IAB      Ectopic      Multiple      Live Births               Home Medications    Prior to Admission medications   Medication Sig Start Date End Date Taking? Authorizing Provider  clotrimazole-betamethasone (LOTRISONE) cream Apply to affected area 2 times daily prn 10/25/22  Yes Becky Augusta, NP  albuterol (VENTOLIN HFA) 108 (90 Base) MCG/ACT inhaler Inhale 2 puffs into the lungs every 4 (four) hours as needed for wheezing or shortness of breath. 09/27/19   Moshe Cipro, NP  Ferrous Sulfate 142 (45 Fe) MG TBCR Take 1 tablet by mouth daily. 01/15/22   Rickard Patience, MD  fluconazole (DIFLUCAN) 10 mg/mL SUSP Take by mouth. Patient not taking: Reported on 01/15/2022    [provider]  MOUNJARO 2.5 MG/0.5ML Pen SMARTSIG:2.5 Milligram(s) SUB-Q Once a Week Patient not taking: Reported on 05/28/2022 01/04/22   [provider]  Multiple Vitamin (MULTI-VITAMIN)  tablet Take 1 tablet by mouth daily.    [provider]  nystatin (MYCOSTATIN/NYSTOP) powder Apply 1 Application topically 3 (three) times daily. 08/25/22   Katha Cabal, DO  triamcinolone ointment (KENALOG) 0.1 % Apply 1 Application topically 2 (two) times daily. 08/22/22   Katha Cabal, DO  Vitamin D, Ergocalciferol, (DRISDOL) 1.25 MG (50000 UNIT) CAPS capsule Take 50,000 Units by mouth once a week. 07/27/22   [provider]    Family History Family History  Problem Relation Age of Onset   Other Mother    Healthy Father     Social History Social History   Tobacco Use   Smoking status: Never   Smokeless tobacco: Never  Vaping Use   Vaping Use: Never used  Substance Use Topics   Alcohol use: Never   Drug use: Never     Allergies   Aspirin, Cephalexin, Pollen extract, Sulfa antibiotics, and Zofran [ondansetron hcl]   Review of Systems Review of Systems  Constitutional:  Negative for fever.  Skin:  Positive for color change and rash.     Physical Exam Triage Vital Signs ED Triage Vitals  Enc  Vitals Group     BP      Pulse      Resp      Temp      Temp src      SpO2      Weight      Height      Head Circumference      Peak Flow      Pain Score      Pain Loc      Pain Edu?      Excl. in [place]?    No data found.  Updated Vital Signs BP (!) 140/81 (BP Location: Right Wrist)   Pulse 66   Temp 98.6 F (37 C) (Oral)   LMP 10/18/2022 (Approximate)   SpO2 98%   Visual Acuity Right Eye Distance:   Left Eye Distance:   Bilateral Distance:    Right Eye Near:   Left Eye Near:    Bilateral Near:     Physical Exam Vitals and nursing note reviewed.  Constitutional:      Appearance: Normal appearance. She is not ill-appearing.  Skin:    General: Skin is warm and dry.     Capillary Refill: Capillary refill takes less than 2 seconds.     Findings: Erythema and rash present.  Neurological:     General: No focal deficit present.      Mental Status: She is alert and oriented to person, place, and time.      UC Treatments / Results  Labs (all labs ordered are listed, but only abnormal results are displayed) Labs Reviewed - No data to display  EKG   Radiology No results found.  Procedures Procedures (including critical care time)  Medications Ordered in UC Medications - No data to display  Initial Impression / Assessment and Plan / UC Course  I have reviewed the triage vital signs and the nursing notes.  Pertinent labs & imaging results that were available during my care of the patient were reviewed by me and considered in my medical decision making (see chart for details).   Patient is a pleasant, nontoxic-appearing 27 year old female presenting for evaluation of a rash under both armpits and swollen lymph nodes that started several weeks ago when she started using a new deodorant.  Patient seen image above, there is some hyperpigmentation and central erythema but the rash is not raised.  It is tender to touch.  Patient does have tender lymph nodes in both axillary regions but they are freely mobile.  The left axilla does not have near the degree of redness as the right imaged above.  The rash appears to be a topical fungal infection and I will treat the patient with Lotrisone cream twice daily until the rash is resolved.  I have advised her that once the rash resolves she should take the cream and apply it for another 2 days to help prevent return.  She states she has stopped the deodorant that causes vaginal irritation and I encouraged her to continue doing so.  If her symptoms do not improve she is to return for reevaluation or follow-up with dermatology.  She denies need for work note.  Final Clinical Impressions(s) / UC Diagnoses   Final diagnoses:  Skin yeast infection     Discharge Instructions      Apply the Lotrisone cream twice daily until the rash has resolved and then for 2 additional  days.  Stop using the deodorant that caused the irritation.  Keep the areas  clean and dry. Leave them open to air as much as possible.  If your symptoms doe not improve return for re-evaluation or see a dermatologist.     ED Prescriptions     Medication Sig Dispense Auth. Provider   clotrimazole-betamethasone (LOTRISONE) cream Apply to affected area 2 times daily prn 45 g Becky Augusta, NP      PDMP not reviewed this encounter.   Becky Augusta, NP 10/25/22 [phone]

## 2022-10-26 ENCOUNTER — Other Ambulatory Visit: Payer: BC Managed Care – PPO

## 2022-10-31 ENCOUNTER — Ambulatory Visit: Payer: BC Managed Care – PPO | Admitting: Oncology

## 2022-10-31 ENCOUNTER — Ambulatory Visit: Payer: BC Managed Care – PPO

## 2022-12-15 ENCOUNTER — Encounter: Payer: Self-pay | Admitting: Occupational Therapy

## 2022-12-23 IMAGING — CR DG ANKLE COMPLETE 3+V*L*
1 series · 3 of 3 positions shown · non-contrast
Comparison: None.

CLINICAL DATA: Fall, left ankle pain

EXAM:
LEFT ANKLE COMPLETE - 3+ VIEW

[Series 1: dg ankle complete left · 0.14mm/px · 3 of 3 slices shown]
[im 1/3]
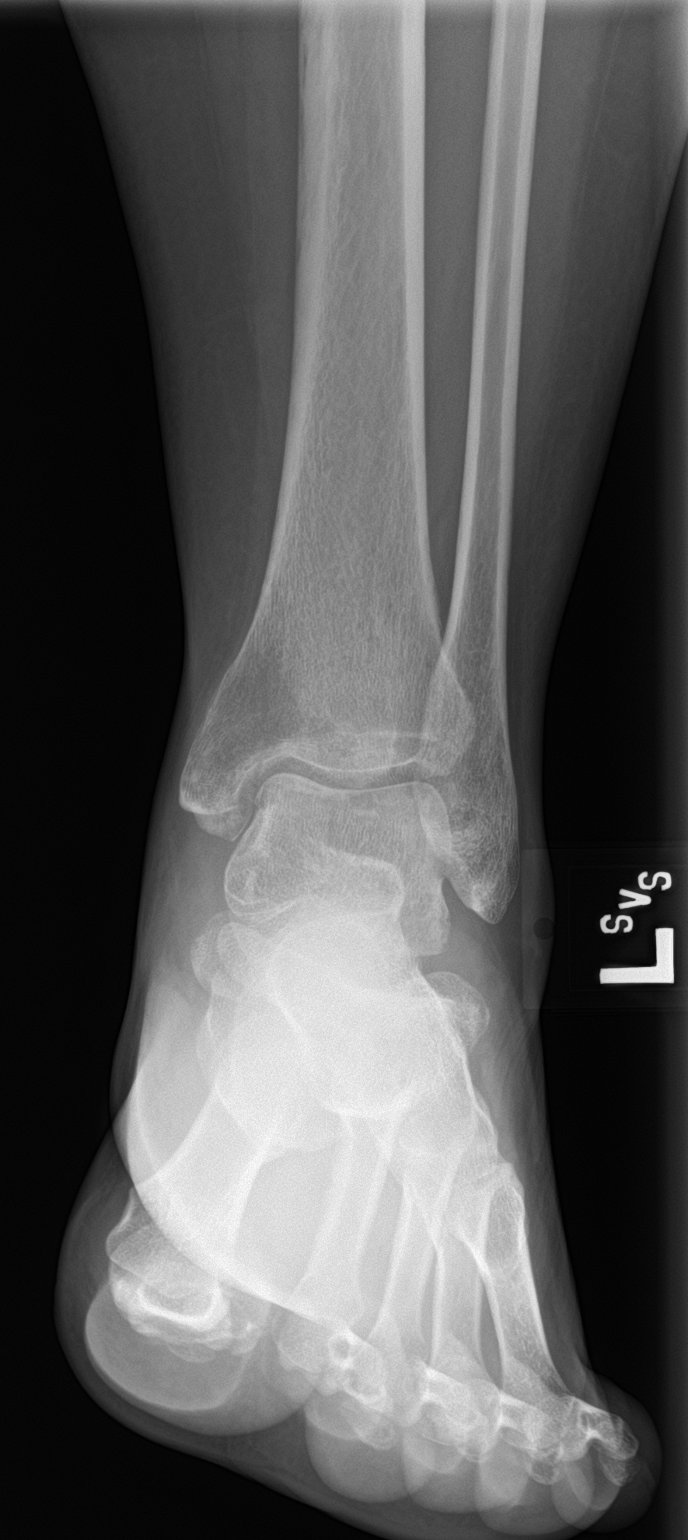
[im 2/3]
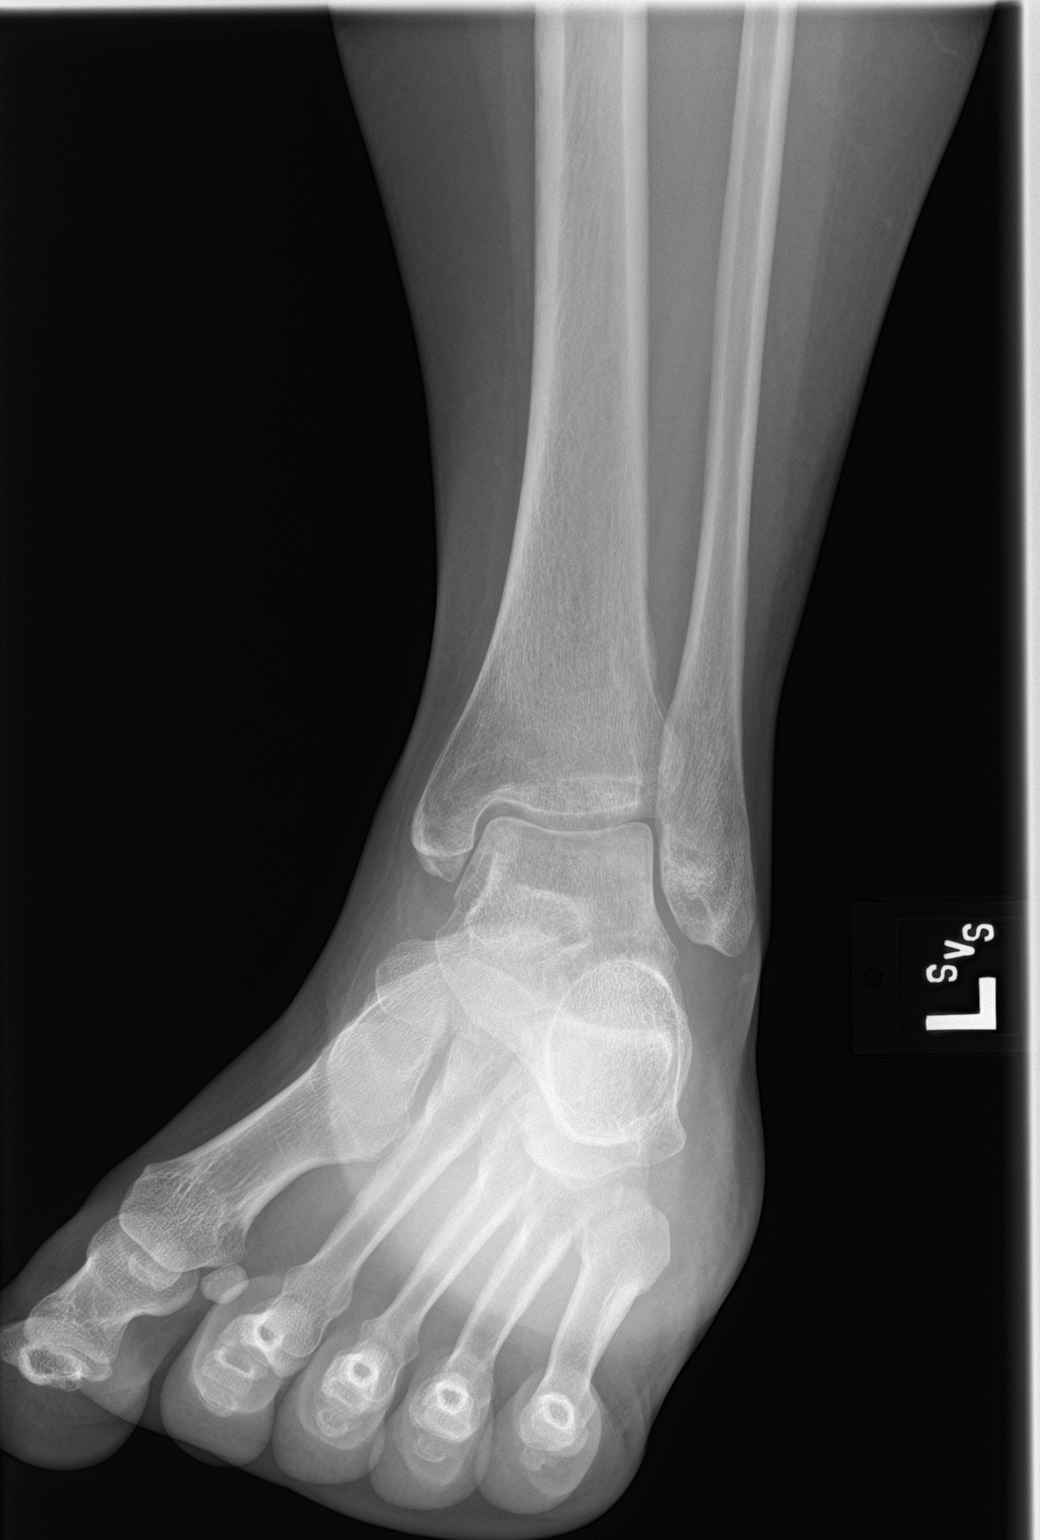
[im 3/3]
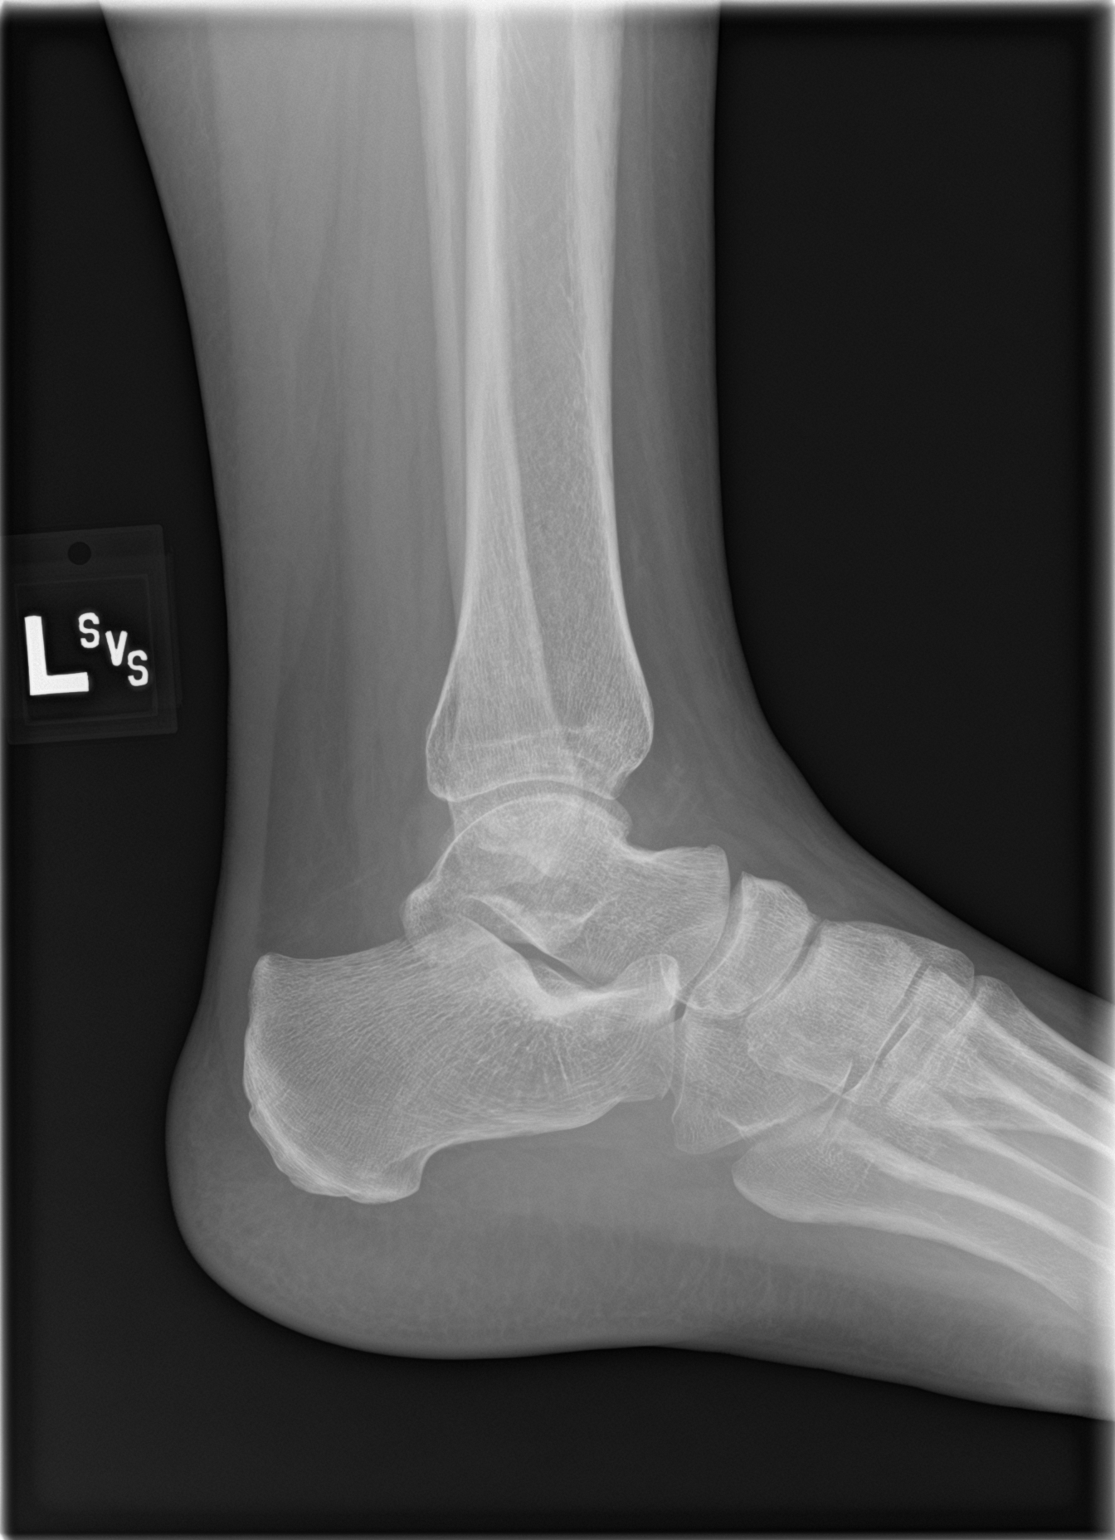

[3 of 3 positions shown; findings below may reference images not displayed]

FINDINGS: There is no evidence of fracture, dislocation, or joint effusion.
There is no evidence of arthropathy or other focal bone abnormality.
Soft tissues are unremarkable.
IMPRESSION: Negative.

## 2023-01-15 ENCOUNTER — Encounter: Payer: Self-pay | Admitting: Oncology

## 2023-01-22 ENCOUNTER — Encounter: Payer: Self-pay | Admitting: Psychiatry

## 2023-01-22 ENCOUNTER — Ambulatory Visit: Payer: Self-pay | Admitting: Psychiatry

## 2023-01-22 NOTE — Progress Notes (Deleted)
[facility]  PATIENT: Kara Collins DOB: 13-Apr-1996  REFERRING CLINICIAN: Loney Laurence, NP HISTORY FROM: *** REASON FOR VISIT: dizziness, empty sella   HISTORICAL  CHIEF COMPLAINT:  No chief complaint on file.   HISTORY OF PRESENT ILLNESS:  *** The patient has suffered from dizziness since 2019. Was treated with meclizine which made her drowsy but did not alleviate her symptoms.  MRI brain 02/16/20 with partially empty sella, otherwise unremarkable  OTHER MEDICAL CONDITIONS: PCOS, iron deficiency anemia, asthma, anxiety, depression   REVIEW OF SYSTEMS: Full 14 system review of systems performed and negative with exception of: ***  ALLERGIES: Allergies  Allergen Reactions   Aspirin Other (See Comments)    Had aspirin-induced asthma at 160 mg of ASA- tolerated 81 mg a day w/out problem.   Had aspirin-induced asthma at 160 mg of ASA- tolerated 81 mg a day w/out problem.   Cephalexin Nausea And Vomiting   Pollen Extract Other (See Comments)    sneezing   Sulfa Antibiotics Nausea And Vomiting   Zofran [Ondansetron Hcl] Nausea And Vomiting    HOME MEDICATIONS: Outpatient Medications Prior to Visit  Medication Sig Dispense Refill   albuterol (VENTOLIN HFA) 108 (90 Base) MCG/ACT inhaler Inhale 2 puffs into the lungs every 4 (four) hours as needed for wheezing or shortness of breath. 18 g 0   clotrimazole-betamethasone (LOTRISONE) cream Apply to affected area 2 times daily prn 45 g 0   Ferrous Sulfate 142 (45 Fe) MG TBCR Take 1 tablet by mouth daily. 90 tablet 1   fluconazole (DIFLUCAN) 10 mg/mL SUSP Take by mouth. (Patient not taking: Reported on 01/15/2022)     MOUNJARO 2.5 MG/0.5ML Pen SMARTSIG:2.5 Milligram(s) SUB-Q Once a Week (Patient not taking: Reported on 05/28/2022)     Multiple Vitamin (MULTI-VITAMIN) tablet Take 1 tablet by mouth daily.     nystatin (MYCOSTATIN/NYSTOP) powder Apply 1 Application topically 3 (three) times daily. 15 g 0    triamcinolone ointment (KENALOG) 0.1 % Apply 1 Application topically 2 (two) times daily. 15 g 0   Vitamin D, Ergocalciferol, (DRISDOL) 1.25 MG (50000 UNIT) CAPS capsule Take 50,000 Units by mouth once a week.     No facility-administered medications prior to visit.    PAST MEDICAL HISTORY: Past Medical History:  Diagnosis Date   Abortion    Anemia    Ankle fracture 01/2021   Asthma    Hematoma 01/2021   abdominal    PAST SURGICAL HISTORY: Past Surgical History:  Procedure Laterality Date   CESAREAN SECTION     DILATION AND CURETTAGE OF UTERUS      FAMILY HISTORY: Family History  Problem Relation Age of Onset   Other Mother    Healthy Father     SOCIAL HISTORY: Social History   Socioeconomic History   Marital status: Married    Spouse name: Not on file   Number of children: Not on file   Years of education: Not on file   Highest education level: Not on file  Occupational History   Not on file  Tobacco Use   Smoking status: Never   Smokeless tobacco: Never  Vaping Use   Vaping status: Never Used  Substance and Sexual Activity   Alcohol use: Never   Drug use: Never   Sexual activity: Yes    Birth control/protection: Condom  Other Topics Concern   Not on file  Social History Narrative   Not on file   Social Determinants of Health  Financial Resource Strain: Low Risk  (05/24/2022)   Received from [facility]   Overall Financial Resource Strain (CARDIA)    Difficulty of Paying Living Expenses: Not hard at all  Food Insecurity: No Food Insecurity (05/24/2022)   Received from [facility]   Hunger Vital Sign    Worried About Running Out of Food in the Last Year: Never true    Ran Out of Food in the Last Year: Never true  Transportation Needs: No Transportation Needs (02/09/2021)   Received from [facility], [facility]   [facility] - Transportation    Lack of Transportation (Medical): No    Lack of Transportation (Non-Medical): No  Physical  Activity: Unknown (05/24/2022)   Received from [facility]   Exercise Vital Sign    Days of Exercise per Week: 0 days    Minutes of Exercise per Session: Not on file  Stress: No Stress Concern Present (05/24/2022)   Received from [facility] - Occupational Stress Questionnaire    Feeling of Stress : Only a little  Social Connections: Socially Integrated (05/24/2022)   Received from [facility]   Social Network    How would you rate your social network (family, work, friends)?: Good participation with social networks  Intimate Partner Violence: Not At Risk (05/24/2022)   Received from [facility]   HITS    Over the last 12 months how often did your partner physically hurt you?: 1    Over the last 12 months how often did your partner insult you or talk down to you?: 1    Over the last 12 months how often did your partner threaten you with physical harm?: 1    Over the last 12 months how often did your partner scream or curse at you?: 1     PHYSICAL EXAM ***  GENERAL EXAM/CONSTITUTIONAL: Vitals: There were no vitals filed for this visit. There is no height or weight on file to calculate BMI. Wt Readings from Last 3 Encounters:  05/28/22 229 lb 8 oz (104.1 kg)  01/15/22 238 lb (108 kg)  04/11/21 223 lb (101.2 kg)   Patient is in no distress; well developed, nourished and groomed; neck is supple  CARDIOVASCULAR: Examination of carotid arteries is normal; no carotid bruits Regular rate and rhythm, no murmurs Examination of peripheral vascular system by observation and palpation is normal  EYES: Pupils round and reactive to light, Visual fields full to confrontation, Extraocular movements intacts,   MUSCULOSKELETAL: Gait, strength, tone, movements noted in Neurologic exam below  NEUROLOGIC: MENTAL STATUS:      No data to display         awake, alert, oriented to person, place and time recent and remote memory  intact normal attention and concentration language fluent, comprehension intact, naming intact fund of knowledge appropriate  CRANIAL NERVE:  2nd - no papilledema or hemorrhages on fundoscopic exam 2nd, 3rd, 4th, 6th - pupils equal and reactive to light, visual fields full to confrontation, extraocular muscles intact, no nystagmus 5th - facial sensation symmetric 7th - facial strength symmetric 8th - hearing intact 9th - palate elevates symmetrically, uvula midline 11th - shoulder shrug symmetric 12th - tongue protrusion midline  MOTOR:  normal bulk and tone, full strength in the BUE, BLE  SENSORY:  normal and symmetric to light touch, pinprick, temperature, vibration  COORDINATION:  finger-nose-finger, fine finger movements normal  REFLEXES:  deep tendon reflexes present and symmetric  GAIT/STATION:  normal     DIAGNOSTIC DATA (LABS, IMAGING, TESTING) - I reviewed patient records, labs, notes, testing and imaging myself where available.  Lab Results  Component Value Date   WBC 5.7 05/28/2022   HGB 11.9 (L) 05/28/2022   HCT 38.3 05/28/2022   MCV 81.5 05/28/2022   PLT 311 05/28/2022      Component Value Date/Time   NA 135 01/15/2022 1201   K 3.9 01/15/2022 1201   CL 103 01/15/2022 1201   CO2 25 01/15/2022 1201   GLUCOSE 90 01/15/2022 1201   BUN 10 01/15/2022 1201   CREATININE 0.78 01/15/2022 1201   CALCIUM 8.6 (L) 01/15/2022 1201   PROT 8.0 01/15/2022 1201   ALBUMIN 4.3 01/15/2022 1201   AST 20 01/15/2022 1201   ALT 12 01/15/2022 1201   ALKPHOS 54 01/15/2022 1201   BILITOT 0.3 01/15/2022 1201   GFRNONAA >60 01/15/2022 1201   GFRAA >60 01/31/2020 1244   No results found for: "CHOL", "HDL", "LDLCALC", "LDLDIRECT", "TRIG", "CHOLHDL" No results found for: "HGBA1C" Lab Results  Component Value Date   VITAMINB12 803 01/15/2022   Lab Results  Component Value Date   TSH 1.841 01/15/2022    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 year old female with ***   No diagnosis found.    PLAN:   No orders of the defined types were placed in this encounter.   No orders of the defined types were placed in this encounter.   No follow-ups on file.    Ocie Doyne, MD  I spent an average of *** chart reviewing and counseling the patient, with at least 50% of the time face to face with the patient.   [facility] [address] [place], [place] [address] [phone]

## 2023-02-17 ENCOUNTER — Encounter: Payer: Self-pay | Admitting: Emergency Medicine

## 2023-02-17 ENCOUNTER — Ambulatory Visit
Admission: EM | Admit: 2023-02-17 | Discharge: 2023-02-17 | Disposition: A | Discharge status: Home / Self Care | Payer: Self-pay | Attending: Emergency Medicine | Admitting: Emergency Medicine

## 2023-02-17 DIAGNOSIS — L304 Erythema intertrigo: Secondary | ICD-10-CM

## 2023-02-17 MED ORDER — CLOTRIMAZOLE-BETAMETHASONE 1-0.05 % EX CREA: TOPICAL_CREAM | CUTANEOUS | 1 refills | Status: DC

## 2023-02-17 NOTE — ED Triage Notes (Signed)
Patient reports itchy rash in her armpits for the past 4 months.  Patient states that she used a new deodorant.

## 2023-02-17 NOTE — Discharge Instructions (Addendum)
Apply the Lotrisone cream twice daily to the rash under your armpits until the rash resolves and then for 3 additional days.  Leave the areas involved open to air is much as possible to help dry them out and kill the fungus.  After showering make sure the area is dried thoroughly.  You can use a hair dryer to help dry the skin and prevent moisture being trapped in the skin folds and allowing more yeast to grow.  Make sure that you are wearing cotton or natural fiber shirts to allow airflow.  Return for reevaluation for any new or worsening symptoms.

## 2023-02-17 NOTE — ED Provider Notes (Signed)
[facility]    CSN: [id number] Arrival date & time: 02/17/23  1345      History   Chief Complaint Chief Complaint  Patient presents with   Rash    RM 8    HPI Kara Collins is a 27 y.o. female.   HPI  27 year old female with a past medical history significant for asthma and anemia presents for evaluation of an itchy rash in both of her armpits that started 4 months ago.  She did use nystatin powder in her left axilla with resolution of symptoms but she ran out and she still has symptoms in the right axilla.  It began after she started using a new deodorant.  She denies any fever or drainage.  The itching is worse at night and also if she becomes hot or after shower.  Past Medical History:  Diagnosis Date   Abortion    Anemia    Ankle fracture 01/2021   Asthma    Hematoma 01/2021   abdominal    Patient Active Problem List   Diagnosis Date Noted   Menorrhagia 05/29/2022   Normocytic anemia 01/15/2022   Raynaud's phenomenon without gangrene 01/15/2022   Hypocalcemia 01/15/2022   Fatigue 01/15/2022    Past Surgical History:  Procedure Laterality Date   CESAREAN SECTION     DILATION AND CURETTAGE OF UTERUS      OB History     Gravida  1   Para      Term      Preterm      AB      Living         SAB      IAB      Ectopic      Multiple      Live Births               Home Medications    Prior to Admission medications   Medication Sig Start Date End Date Taking? Authorizing Provider  clotrimazole-betamethasone (LOTRISONE) cream Apply to affected area 2 times daily prn 02/17/23  Yes Becky Augusta, NP  albuterol (VENTOLIN HFA) 108 (90 Base) MCG/ACT inhaler Inhale 2 puffs into the lungs every 4 (four) hours as needed for wheezing or shortness of breath. 09/27/19   Moshe Cipro, FNP  Ferrous Sulfate 142 (45 Fe) MG TBCR Take 1 tablet by mouth daily. 01/15/22   Rickard Patience, MD  fluconazole (DIFLUCAN) 10 mg/mL SUSP Take by  mouth. Patient not taking: Reported on 01/15/2022    [provider]  MOUNJARO 2.5 MG/0.5ML Pen SMARTSIG:2.5 Milligram(s) SUB-Q Once a Week Patient not taking: Reported on 05/28/2022 01/04/22   [provider]  Multiple Vitamin (MULTI-VITAMIN) tablet Take 1 tablet by mouth daily.    [provider]  nystatin (MYCOSTATIN/NYSTOP) powder Apply 1 Application topically 3 (three) times daily. 08/25/22   Katha Cabal, DO  triamcinolone ointment (KENALOG) 0.1 % Apply 1 Application topically 2 (two) times daily. 08/22/22   Katha Cabal, DO  Vitamin D, Ergocalciferol, (DRISDOL) 1.25 MG (50000 UNIT) CAPS capsule Take 50,000 Units by mouth once a week. 07/27/22   [provider]    Family History Family History  Problem Relation Age of Onset   Other Mother    Healthy Father     Social History Social History   Tobacco Use   Smoking status: Never   Smokeless tobacco: Never  Vaping Use   Vaping status: Never Used  Substance Use Topics   Alcohol use: Never  Drug use: Never     Allergies   Aspirin, Cephalexin, Pollen extract, Sulfa antibiotics, and Zofran [ondansetron hcl]   Review of Systems Review of Systems  Constitutional:  Negative for fever.  Skin:  Positive for color change and rash.     Physical Exam Triage Vital Signs ED Triage Vitals  Encounter Vitals Group     BP 02/17/23 1410 120/69     Systolic BP Percentile --      Diastolic BP Percentile --      Pulse Rate 02/17/23 1410 67     Resp 02/17/23 1410 14     Temp 02/17/23 1410 98.4 F (36.9 C)     Temp Source 02/17/23 1410 Oral     SpO2 02/17/23 1410 100 %     Weight 02/17/23 1408 229 lb 8 oz (104.1 kg)     Height 02/17/23 1408 5\' 2"  (1.575 m)     Head Circumference --      Peak Flow --      Pain Score 02/17/23 1408 9     Pain Loc --      Pain Education --      Exclude from Growth Chart --    No data found.  Updated Vital Signs BP 120/69 (BP Location: Left Arm)   Pulse  67   Temp 98.4 F (36.9 C) (Oral)   Resp 14   Ht 5\' 2"  (1.575 m)   Wt 229 lb 8 oz (104.1 kg)   LMP 02/10/2023 (Approximate)   SpO2 100%   BMI 41.98 kg/m   Visual Acuity Right Eye Distance:   Left Eye Distance:   Bilateral Distance:    Right Eye Near:   Left Eye Near:    Bilateral Near:     Physical Exam Vitals and nursing note reviewed.  Constitutional:      Appearance: Normal appearance. She is not ill-appearing.  HENT:     Head: Normocephalic and atraumatic.  Skin:    General: Skin is warm and dry.     Capillary Refill: Capillary refill takes less than 2 seconds.     Findings: Erythema and rash present.  Neurological:     General: No focal deficit present.     Mental Status: She is alert and oriented to person, place, and time.      UC Treatments / Results  Labs (all labs ordered are listed, but only abnormal results are displayed) Labs Reviewed - No data to display  EKG   Radiology No results found.  Procedures Procedures (including critical care time)  Medications Ordered in UC Medications - No data to display  Initial Impression / Assessment and Plan / UC Course  I have reviewed the triage vital signs and the nursing notes.  Pertinent labs & imaging results that were available during my care of the patient were reviewed by me and considered in my medical decision making (see chart for details).   Patient is a pleasant, nontoxic-appearing 27 year old female presenting for evaluation of itchy underarm rash that has been present for last 4 months.  The left axilla has largely resolved but the right axilla continues to have an itchy rash.    As you can see in image above it is in the skin folds when the skin is erythematous but no excoriation or open lesions.  I will treat the patient for intertrigo with Lotrisone cream twice daily until the rash resolves.  I have advised her then to use it for an additional 3  days to ensure resolution of symptoms.  She  should also keep the areas open to air is much as possible and make sure that she tries some thoroughly after shower using a hair dryer.   Final Clinical Impressions(s) / UC Diagnoses   Final diagnoses:  Intertrigo     Discharge Instructions      Apply the Lotrisone cream twice daily to the rash under your armpits until the rash resolves and then for 3 additional days.  Leave the areas involved open to air is much as possible to help dry them out and kill the fungus.  After showering make sure the area is dried thoroughly.  You can use a hair dryer to help dry the skin and prevent moisture being trapped in the skin folds and allowing more yeast to grow.  Make sure that you are wearing cotton or natural fiber shirts to allow airflow.  Return for reevaluation for any new or worsening symptoms.     ED Prescriptions     Medication Sig Dispense Auth. Provider   clotrimazole-betamethasone (LOTRISONE) cream Apply to affected area 2 times daily prn 45 g Becky Augusta, NP      PDMP not reviewed this encounter.   Becky Augusta, NP 02/17/23 1436

## 2023-04-03 ENCOUNTER — Other Ambulatory Visit: Payer: Self-pay | Admitting: General Surgery

## 2023-04-03 DIAGNOSIS — K8020 Calculus of gallbladder without cholecystitis without obstruction: Secondary | ICD-10-CM

## 2023-04-08 ENCOUNTER — Encounter: Payer: Self-pay | Admitting: Emergency Medicine

## 2023-04-08 ENCOUNTER — Ambulatory Visit
Admission: EM | Admit: 2023-04-08 | Discharge: 2023-04-08 | Disposition: A | Discharge status: Home / Self Care | Payer: Federal, State, Local not specified - PPO | Attending: Family Medicine | Admitting: Family Medicine

## 2023-04-08 DIAGNOSIS — R1033 Periumbilical pain: Secondary | ICD-10-CM | POA: Insufficient documentation

## 2023-04-08 DIAGNOSIS — Z8719 Personal history of other diseases of the digestive system: Secondary | ICD-10-CM | POA: Insufficient documentation

## 2023-04-08 DIAGNOSIS — R110 Nausea: Secondary | ICD-10-CM | POA: Insufficient documentation

## 2023-04-08 DIAGNOSIS — R519 Headache, unspecified: Secondary | ICD-10-CM | POA: Insufficient documentation

## 2023-04-08 LAB — CBC WITH DIFFERENTIAL/PLATELET
Abs Immature Granulocytes: 0.01 10*3/uL (ref 0.00–0.07)
Basophils Absolute: 0 10*3/uL (ref 0.0–0.1)
Basophils Relative: 0 %
Eosinophils Absolute: 0 10*3/uL (ref 0.0–0.5)
Eosinophils Relative: 1 %
HCT: 36.2 % (ref 36.0–46.0)
Hemoglobin: 11.4 g/dL — ABNORMAL LOW (ref 12.0–15.0)
Immature Granulocytes: 0 %
Lymphocytes Relative: 30 %
Lymphs Abs: 1.1 10*3/uL (ref 0.7–4.0)
MCH: 26.3 pg (ref 26.0–34.0)
MCHC: 31.5 g/dL (ref 30.0–36.0)
MCV: 83.4 fL (ref 80.0–100.0)
Monocytes Absolute: 0.3 10*3/uL (ref 0.1–1.0)
Monocytes Relative: 8 %
Neutro Abs: 2.2 10*3/uL (ref 1.7–7.7)
Neutrophils Relative %: 61 %
Platelets: 305 10*3/uL (ref 150–400)
RBC: 4.34 MIL/uL (ref 3.87–5.11)
RDW: 13.7 % (ref 11.5–15.5)
WBC: 3.6 10*3/uL — ABNORMAL LOW (ref 4.0–10.5)
nRBC: 0 % (ref 0.0–0.2)

## 2023-04-08 LAB — URINALYSIS, ROUTINE W REFLEX MICROSCOPIC
Bilirubin Urine: NEGATIVE
Glucose, UA: NEGATIVE mg/dL
Hgb urine dipstick: NEGATIVE
Ketones, ur: NEGATIVE mg/dL
Nitrite: NEGATIVE
Specific Gravity, Urine: 1.02 (ref 1.005–1.030)
pH: 7.5 (ref 5.0–8.0)

## 2023-04-08 LAB — COMPREHENSIVE METABOLIC PANEL
ALT: 14 U/L (ref 0–44)
AST: 19 U/L (ref 15–41)
Albumin: 4.4 g/dL (ref 3.5–5.0)
Alkaline Phosphatase: 54 U/L (ref 38–126)
Anion gap: 7 (ref 5–15)
BUN: 12 mg/dL (ref 6–20)
CO2: 24 mmol/L (ref 22–32)
Calcium: 9 mg/dL (ref 8.9–10.3)
Chloride: 104 mmol/L (ref 98–111)
Creatinine, Ser: 0.67 mg/dL (ref 0.44–1.00)
GFR, Estimated: >60 mL/min (ref >60)
Glucose, Bld: 93 mg/dL (ref 70–99)
Potassium: 4.1 mmol/L (ref 3.5–5.1)
Sodium: 135 mmol/L (ref 135–145)
Total Bilirubin: 0.5 mg/dL (ref 0.3–1.2)
Total Protein: 8.5 g/dL — ABNORMAL HIGH (ref 6.5–8.1)

## 2023-04-08 LAB — URINALYSIS, MICROSCOPIC (REFLEX)

## 2023-04-08 LAB — PREGNANCY, URINE: Preg Test, Ur: NEGATIVE

## 2023-04-08 LAB — LIPASE, BLOOD: Lipase: 26 U/L (ref 11–51)

## 2023-04-08 LAB — GROUP A STREP BY PCR: Group A Strep by PCR: NOT DETECTED

## 2023-04-08 MED ORDER — METOCLOPRAMIDE HCL 10 MG PO TABS: 10.0000 mg | ORAL_TABLET | Freq: Three times a day (TID) | ORAL | 0 refills | Status: DC | PRN

## 2023-04-08 MED ORDER — FLUCONAZOLE 150 MG PO TABS: 150.0000 mg | ORAL_TABLET | ORAL | 0 refills | Status: AC

## 2023-04-08 MED ORDER — ACETAMINOPHEN 325 MG PO TABS
975.0000 mg | ORAL_TABLET | Freq: Once | ORAL | Status: AC
  Administered 2023-04-08: 975 mg via ORAL

## 2023-04-08 NOTE — ED Triage Notes (Signed)
Pt c/o of nausea and headache x 3 days. Pt has taken tylenol for headache.

## 2023-04-08 NOTE — ED Provider Notes (Signed)
[facility]    CSN: [id number] Arrival date & time: 04/08/23  0827      History   Chief Complaint No chief complaint on file.   HPI Kara Collins is a 27 y.o. female.   HPI   Kara Collins presents for nausea and abdominal pain with headache that started last Friday.  Took Tylenol with some relief.   Patient's last menstrual period was 03/16/2023.   HEADACHE   Onset: *** Location: *** Quality: *** Frequency: *** Precipitating factors: *** Prior treatment: ***  Associated Symptoms Nausea/vomiting: {YES/NO/WILD CARDS:18581}  Photophobia/phonophobia: {YES/NO/WILD CARDS:18581}  Tearing of eyes: {YES/NO/WILD CARDS:18581}  Sinus pain/pressure: {YES/NO/WILD CARDS:18581}  Family hx migraine: {YES/NO/WILD DGUYQ:03474}  Personal stressors: {YES/NO/WILD CARDS:18581}  Relation to menstrual cycle: {YES/NO/WILD CARDS:18581} ***  Red Flags Fever: {YES/NO/WILD CARDS:18581}  Neck pain/stiffness: {YES/NO/WILD CARDS:18581}  Vision/speech/swallow/hearing difficulty: {YES/NO/WILD CARDS:18581}  Focal weakness/numbness: {YES/NO/WILD CARDS:18581}  Altered mental status: {YES/NO/WILD QVZDG:38756}  Trauma: {YES/NO/WILD EPPIR:51884}  New type of headache: {YES/NO/WILD CARDS:18581}  Anticoagulant use: {YES/NO/WILD ZYSAY:30160}  H/o cancer/HIV/Pregnancy: {YES/NO/WILD FUXNA:35573}     Fever : no  Chills: no Sore throat: no   Cough: no Sputum: no Nasal congestion : no  Rhinorrhea: no Myalgias: no Appetite: normal  Hydration: normal  Abdominal pain: no Nausea: no Vomiting: no Sleep disturbance: no Headache: no      Past Medical History:  Diagnosis Date   Abortion    Anemia    Ankle fracture 01/2021   Asthma    Hematoma 01/2021   abdominal    Patient Active Problem List   Diagnosis Date Noted   Menorrhagia 05/29/2022   Normocytic anemia 01/15/2022   Raynaud's phenomenon without gangrene 01/15/2022   Hypocalcemia 01/15/2022   Fatigue 01/15/2022     Past Surgical History:  Procedure Laterality Date   CESAREAN SECTION     DILATION AND CURETTAGE OF UTERUS      OB History     Gravida  1   Para      Term      Preterm      AB      Living         SAB      IAB      Ectopic      Multiple      Live Births               Home Medications    Prior to Admission medications   Medication Sig Start Date End Date Taking? Authorizing Provider  albuterol (VENTOLIN HFA) 108 (90 Base) MCG/ACT inhaler Inhale 2 puffs into the lungs every 4 (four) hours as needed for wheezing or shortness of breath. 09/27/19   Moshe Cipro, FNP  clotrimazole-betamethasone (LOTRISONE) cream Apply to affected area 2 times daily prn 02/17/23   Becky Augusta, NP  Ferrous Sulfate 142 (45 Fe) MG TBCR Take 1 tablet by mouth daily. 01/15/22   Rickard Patience, MD  fluconazole (DIFLUCAN) 10 mg/mL SUSP Take by mouth. Patient not taking: Reported on 01/15/2022    [provider]  MOUNJARO 2.5 MG/0.5ML Pen SMARTSIG:2.5 Milligram(s) SUB-Q Once a Week Patient not taking: Reported on 05/28/2022 01/04/22   [provider]  Multiple Vitamin (MULTI-VITAMIN) tablet Take 1 tablet by mouth daily.    [provider]  nystatin (MYCOSTATIN/NYSTOP) powder Apply 1 Application topically 3 (three) times daily. 08/25/22   Katha Cabal, DO  triamcinolone ointment (KENALOG) 0.1 % Apply 1 Application topically 2 (two) times daily. 08/22/22   Katha Cabal, DO  Vitamin D, Ergocalciferol, (DRISDOL) 1.25 MG (50000 UNIT) CAPS capsule Take 50,000 Units by mouth once a week. 07/27/22   [provider]    Family History Family History  Problem Relation Age of Onset   Other Mother    Healthy Father     Social History Social History   Tobacco Use   Smoking status: Never   Smokeless tobacco: Never  Vaping Use   Vaping status: Never Used  Substance Use Topics   Alcohol use: Never   Drug use: Never     Allergies   Aspirin, Cephalexin,  Pollen extract, Sulfa antibiotics, and Zofran [ondansetron hcl]   Review of Systems Review of Systems: negative unless otherwise stated in HPI.      Physical Exam Triage Vital Signs ED Triage Vitals  Encounter Vitals Group     BP      Systolic BP Percentile      Diastolic BP Percentile      Pulse      Resp      Temp      Temp src      SpO2      Weight      Height      Head Circumference      Peak Flow      Pain Score      Pain Loc      Pain Education      Exclude from Growth Chart    No data found.  Updated Vital Signs There were no vitals taken for this visit.  Visual Acuity Right Eye Distance:   Left Eye Distance:   Bilateral Distance:    Right Eye Near:   Left Eye Near:    Bilateral Near:     Physical Exam GEN: pleasant well appearing fe***female, in no acute distress *** CV: regular rate and rhythm, no murmurs appreciated *** RESP: no increased work of breathing, clear to ascultation bilaterally ABD: Bowel sounds present. Soft, non-tender, non-distended. *** MSK: no edema, or calf tenderness SKIN: warm, dry, no rash on visible skin NEURO:  alert, oriented, speech normal, CN 2-12 grossly intact, no facial droop,  sensation grossly intact, ***strength 5/5 bilateral UE and LE, normal coordination, ***normal finger to nose, ***normal heel to shin, ***Negative Romberg , normal gait*** PSYCH: Normal affect, appropriate speech and behavior       UC Treatments / Results  Labs (all labs ordered are listed, but only abnormal results are displayed) Labs Reviewed - No data to display  EKG  If EKG performed, see my interpretation in the MDM section  Radiology No results found.   Procedures Procedures (including critical care time)  Medications Ordered in UC Medications - No data to display  Initial Impression / Assessment and Plan / UC Course  I have reviewed the triage vital signs and the nursing notes.  Pertinent labs & imaging results that were  available during my care of the patient were reviewed by me and considered in my medical decision making (see chart for details).       Patient is a 27 y.o. female  who presents for ***.  Overall patient is nontoxic-appearing and ***afebrile.  Vital signs stable.   ED and return precautions given and patient/guardian voiced understanding. Discussed MDM, treatment plan and plan for follow-up with patient*** who agrees with plan.     Discussed MDM, treatment plan and plan for follow-up with patient***who agrees with plan.   Final Clinical Impressions(s) / UC Diagnoses   Final  diagnoses:  None   Discharge Instructions   None    ED Prescriptions   None    PDMP not reviewed this encounter.

## 2023-04-08 NOTE — Discharge Instructions (Addendum)
You are not pregnant.  Your urine sample will be sent for culture, someone will call you to start antibiotics if needed.  You did have some yeast in your urine and we will treat this with Diflucan.  Your liver, pancreas and kidneys are functioning normal.  Your electrolytes are normal.  Your white blood cell was somewhat low.  Have your primary care doctor repeat this in about 4 to 6 weeks.  Stop at the pharmacy to pick up your medications.  If your nausea does not improve or your abdominal pain gets worse, go to the emergency department/ER for a CT scan of your belly which can have a great view of your gallbladder.

## 2023-04-09 ENCOUNTER — Encounter: Payer: Self-pay | Admitting: Oncology

## 2023-04-09 ENCOUNTER — Other Ambulatory Visit: Payer: Self-pay

## 2023-04-11 ENCOUNTER — Ambulatory Visit
Admission: RE | Admit: 2023-04-11 | Discharge: 2023-04-11 | Disposition: A | Discharge status: Home / Self Care | Payer: Federal, State, Local not specified - PPO | Source: Ambulatory Visit | Attending: General Surgery | Admitting: General Surgery

## 2023-04-11 ENCOUNTER — Encounter: Payer: Self-pay | Admitting: Oncology

## 2023-04-11 DIAGNOSIS — K8020 Calculus of gallbladder without cholecystitis without obstruction: Secondary | ICD-10-CM

## 2023-05-30 ENCOUNTER — Ambulatory Visit: Payer: Federal, State, Local not specified - PPO | Attending: Sports Medicine | Admitting: Occupational Therapy

## 2023-05-30 DIAGNOSIS — G5621 Lesion of ulnar nerve, right upper limb: Secondary | ICD-10-CM | POA: Insufficient documentation

## 2023-05-30 DIAGNOSIS — M6281 Muscle weakness (generalized): Secondary | ICD-10-CM | POA: Diagnosis present

## 2023-05-30 DIAGNOSIS — M25521 Pain in right elbow: Secondary | ICD-10-CM | POA: Diagnosis present

## 2023-06-08 NOTE — Therapy (Signed)
OUTPATIENT OCCUPATIONAL THERAPY ORTHO EVALUATION  Patient Name: Kara Collins MRN: [id number] DOB:[birth year], 27 y.o., female   PCP: [facility] REFERRING PROVIDER: Dorthula Nettles  END OF SESSION:  OT End of Session - 06/08/23 1905     Visit Number 1    Number of Visits 12    Date for OT Re-Evaluation 07/11/23    OT Start Time 1315    OT Stop Time 1400    OT Time Calculation (min) 45 min    Activity Tolerance Patient tolerated treatment well    Behavior During Therapy [facility] for tasks assessed/performed             Past Medical History:  Diagnosis Date   Abortion    Anemia    Ankle fracture 01/2021   Asthma    Hematoma 01/2021   abdominal   Past Surgical History:  Procedure Laterality Date   CESAREAN SECTION     DILATION AND CURETTAGE OF UTERUS     Patient Active Problem List   Diagnosis Date Noted   Menorrhagia 05/29/2022   Normocytic anemia 01/15/2022   Raynaud's phenomenon without gangrene 01/15/2022   Hypocalcemia 01/15/2022   Fatigue 01/15/2022    ONSET DATE: 05/14/2023  REFERRING DIAG: right elbow pain, cubital tunnel syndrome, ulnar nerve lesion, subacromial impingement of right shoulder  THERAPY DIAG:  Cubital tunnel syndrome on right  Pain in right elbow  Muscle weakness (generalized)  Rationale for Evaluation and Treatment: Rehabilitation  SUBJECTIVE:   SUBJECTIVE STATEMENT: Pt reports popping at her wrist, hand and elbow, sounds like she is popping her knuckles, happens with movement/ROM and feels it is moving up her shoulder, started out at her hand.  She feels aching and weakness up to the right shoulder.  She reports numbness, tingling in the elbow, wrist and fingers.  Pt accompanied by: significant other  PERTINENT HISTORY: Per MD note on 05/14/23: She notes continued right ulnar elbow pain with radiation down her forearm to her fourth/fifth digit. She also is feeling some shoulder pain rating down her lateral arm. She feels like the  symptoms are worsening and affecting her right arm mobility. She currently rates her pain severity as 4/10. She previously has been treated with occupational therapy, short arm wrist brace, blocker at the elbow which limits flexion, meloxicam. She reports associated numbness and tingling in the hand, feeling of weakness. She denies associated elbow swelling, elbow locking/catching, elbow instability, fevers or chills, night sweats, weight loss, skin color change, pain at night.   PRECAUTIONS: None   WEIGHT BEARING RESTRICTIONS: No  PAIN:  Are you having pain? Yes: NPRS scale: 8 Pain location: elbow and wrist  Pain description: aching, tingling Aggravating factors: active use with tasks Relieving factors: nothing currently.  FALLS: Has patient fallen in last 6 months? No  LIVING ENVIRONMENT: Lives with: lives with their family and lives with their spouse Lives in: House/apartment Has following equipment at home:  none  PLOF: Independent  PATIENT GOALS: Pt reports she would like to get rid of pain, be able to use her arm normally for everyday tasks, complete job duties  NEXT MD VISIT: unsure  OBJECTIVE:  Note: Objective measures were completed at Evaluation unless otherwise noted.  HAND DOMINANCE: Right  ADLs: Overall ADLs: Pt is independent with overall basic self care tasks.  She reports increased difficulty with lifting items, completing work tasks  FUNCTIONAL OUTCOME MEASURES: FOTO: TBD  UPPER EXTREMITY ROM:     Active ROM Right eval Left eval  Shoulder  flexion WNL WNL  Shoulder abduction WNL WNL  Shoulder adduction    Shoulder extension WNL WNL  Shoulder internal rotation    Shoulder external rotation    Elbow flexion WNL WNL  Elbow extension WNL WNL  Wrist flexion WNL WNL  Wrist extension WNL WNL  Wrist ulnar deviation    Wrist radial deviation    Wrist pronation 90 90  Wrist supination 88 90  (Blank rows = not tested)  Active ROM Right eval Left eval   Thumb MCP (0-60)    Thumb IP (0-80)    Thumb Radial abd/add (0-55)     Thumb Palmar abd/add (0-45)     Thumb Opposition to Small Finger     Index MCP (0-90)     Index PIP (0-100)     Index DIP (0-70)      Long MCP (0-90)      Long PIP (0-100)      Long DIP (0-70)      Ring MCP (0-90)      Ring PIP (0-100)      Ring DIP (0-70)      Little MCP (0-90)      Little PIP (0-100)      Little DIP (0-70)      (Blank rows = not tested)   UPPER EXTREMITY MMT:     MMT Right eval Left eval  Shoulder flexion 4/5 5/5  Shoulder abduction 4/5 5/5  Shoulder adduction    Shoulder extension    Shoulder internal rotation    Shoulder external rotation    Middle trapezius    Lower trapezius    Elbow flexion 4/5 with pain 5/5  Elbow extension    Wrist flexion 4/5 5/5  Wrist extension 4/5 5/5  Wrist ulnar deviation    Wrist radial deviation    Wrist pronation    Wrist supination    (Blank rows = not tested)  HAND FUNCTION: Grip strength: Right: 50 lbs; Left: 98 lbs, Lateral pinch: Right: 9 lbs, Left: 18 lbs, and 3 point pinch: Right: 9 lbs, Left: 18 lbs  COORDINATION: Impaired due to increased numbness, tingling in right hand, wrist and fingers.   SENSATION: Pt reports numbness, tingling in elbow, wrist and digits.  EDEMA: mild edema noted in forearm and right elbow  COGNITION: Overall cognitive status: Within functional limits for tasks assessed  TREATMENT DATE: 05/30/2023                                                                                                                           Modalities: Contrast bath:  Time: 9 Location: right elbow To decrease pain, inflammation, increase tissue mobility and increase ROM   PATIENT EDUCATION: Education details: contrast, ROM, use of foam roller Person educated: Patient Education method: Medical illustrator Education comprehension: verbalized understanding  HOME EXERCISE PROGRAM: Pt to start with use of  contrast at home to decrease inflammation and edema, ROM and foam roller  GOALS: Goals  reviewed with patient? Yes  SHORT TERM GOALS: Target date: 06/20/2023  Pt will demonstrate HEP for RUE with modified independence  Baseline: no current program Goal status: INITIAL   LONG TERM GOALS: Target date: 07/11/2023  Patient to demonstrate decreased pain in right wrist and elbow for patient to perform ADLs pain-free.  Baseline: pain 8/10 with use at eval  Goal status: INITIAL  2.  Right elbow pain and symptoms in forearm to hand decreased to less than 2/10 with increase grip and prehension to play with kids and cook with more ease  Baseline: Pain 8/10 and increased difficulty with completion of these tasks. Goal status: INITIAL  3.  Right grip and prehension strength increased to within normal limits compared to the left for her age to return to perform cleaning, cooking and work with pain less than 2/10  Baseline:  Goal status: INITIAL  ASSESSMENT:  CLINICAL IMPRESSION: Patient is a 27 y.o. female who was seen today for occupational therapy evaluation for right elbow pain, ulnar nerve lesion, cubital tunnel syndrome and subacromial impingement of the right shoulder. Pt presents with epicondyle tenderness, pain in the elbow, forearm with supination, wrist and hand 8/10 overall with use, decreased strength and functional use for ADL and IADL tasks.  Pt reports a popping noise when moving digits, wrist and elbow and feels it is slowly moving up her arm to her shoulder.  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  Pt would benefit from skilled OT services to maximize safety and independence in necessary daily tasks at work, home and school.    PERFORMANCE DEFICITS: in functional skills including ADLs, IADLs, coordination, dexterity, sensation, edema, ROM, strength, pain, fascial restrictions, flexibility, body mechanics, and UE functional  use,  and psychosocial skills including environmental adaptation, habits, and routines and behaviors.   IMPAIRMENTS: are limiting patient from ADLs, IADLs, rest and sleep, work, and social participation.   COMORBIDITIES: may have co-morbidities  that affects occupational performance. Patient will benefit from skilled OT to address above impairments and improve overall function.  MODIFICATION OR ASSISTANCE TO COMPLETE EVALUATION: Min-Moderate modification of tasks or assist with assess necessary to complete an evaluation.  OT OCCUPATIONAL PROFILE AND HISTORY: Detailed assessment: Review of records and additional review of physical, cognitive, psychosocial history related to current functional performance.  CLINICAL DECISION MAKING: Moderate - several treatment options, min-mod task modification necessary  REHAB POTENTIAL: Good  EVALUATION COMPLEXITY: Moderate      PLAN:  OT FREQUENCY: 1-2x/week  OT DURATION: 6 weeks  PLANNED INTERVENTIONS: 97168 OT Re-evaluation, 97535 self care/ADL training, 16109 therapeutic exercise, 97530 therapeutic activity, 97112 neuromuscular re-education, 97140 manual therapy, 97035 ultrasound, 97018 paraffin, 60454 fluidotherapy, 97010 moist heat, 97010 cryotherapy, 97034 contrast bath, 97033 iontophoresis, 97760 Orthotics management and training, 09811 Splinting (initial encounter), and patient/family education  RECOMMENDED OTHER SERVICES: none currently  CONSULTED AND AGREED WITH PLAN OF CARE: Patient and family member/caregiver   Byanka Landrus Cornelius Moras, OTR/L, CLT  06/08/2023, 7:07 PM

## 2023-06-13 ENCOUNTER — Ambulatory Visit: Payer: Federal, State, Local not specified - PPO | Attending: Sports Medicine | Admitting: Occupational Therapy

## 2023-06-27 ENCOUNTER — Ambulatory Visit: Payer: Federal, State, Local not specified - PPO | Admitting: Occupational Therapy

## 2023-07-08 ENCOUNTER — Ambulatory Visit: Payer: Federal, State, Local not specified - PPO | Admitting: Occupational Therapy

## 2023-07-16 ENCOUNTER — Ambulatory Visit: Payer: Federal, State, Local not specified - PPO | Attending: Sports Medicine | Admitting: Occupational Therapy

## 2023-07-16 DIAGNOSIS — M25521 Pain in right elbow: Secondary | ICD-10-CM | POA: Diagnosis present

## 2023-07-16 DIAGNOSIS — M6281 Muscle weakness (generalized): Secondary | ICD-10-CM | POA: Insufficient documentation

## 2023-07-16 DIAGNOSIS — G5621 Lesion of ulnar nerve, right upper limb: Secondary | ICD-10-CM | POA: Insufficient documentation

## 2023-07-16 NOTE — Therapy (Signed)
 OUTPATIENT OCCUPATIONAL THERAPY ORTHO TREATMENT  Patient Name: Alaura Schippers MRN: [id number] DOB:1996-03-25, 28 y.o., female   PCP: Doristine Ee REFERRING PROVIDER: Sharrie Barter  END OF SESSION:  OT End of Session - 07/16/23 2136     Visit Number 2    Number of Visits 12    Date for OT Re-Evaluation 09/10/23    OT Start Time 1118    OT Stop Time 1202    OT Time Calculation (min) 44 min    Activity Tolerance Patient tolerated treatment well    Behavior During Therapy [facility] for tasks assessed/performed             Past Medical History:  Diagnosis Date   Abortion    Anemia    Ankle fracture 01/2021   Asthma    Hematoma 01/2021   abdominal   Past Surgical History:  Procedure Laterality Date   CESAREAN SECTION     DILATION AND CURETTAGE OF UTERUS     Patient Active Problem List   Diagnosis Date Noted   Menorrhagia 05/29/2022   Normocytic anemia 01/15/2022   Raynaud's phenomenon without gangrene 01/15/2022   Hypocalcemia 01/15/2022   Fatigue 01/15/2022    ONSET DATE: 05/14/2023  REFERRING DIAG: right elbow pain, cubital tunnel syndrome, ulnar nerve lesion, subacromial impingement of right shoulder  THERAPY DIAG:  Muscle weakness (generalized)  Cubital tunnel syndrome on right  Pain in right elbow  Rationale for Evaluation and Treatment: Rehabilitation  SUBJECTIVE:   SUBJECTIVE STATEMENT: I wanted to see you, to see what you think.  I feel like my shoulder is bothering now more than my wrist and elbow.  I still work at [employer] and really.  In for pelvic health they referred me to start Pilates but I wanted to check and see what you think. Pt accompanied by: significant other  PERTINENT HISTORY: Per MD note on 05/14/23: She notes continued right ulnar elbow pain with radiation down her forearm to her fourth/fifth digit. She also is feeling some shoulder pain rating down her lateral arm. She feels like the symptoms are worsening and affecting her right arm  mobility. She currently rates her pain severity as 4/10. She previously has been treated with occupational therapy, short arm wrist brace, blocker at the elbow which limits flexion, meloxicam. She reports associated numbness and tingling in the hand, feeling of weakness. She denies associated elbow swelling, elbow locking/catching, elbow instability, fevers or chills, night sweats, weight loss, skin color change, pain at night.   PRECAUTIONS: None   WEIGHT BEARING RESTRICTIONS: No  PAIN:  Are you having pain?  10/10 at the end of the day at the right shoulder. FALLS: Has patient fallen in last 6 months? No  LIVING ENVIRONMENT: Lives with: lives with their family and lives with their spouse Lives in: House/apartment Has following equipment at home:  none  PLOF: Independent  PATIENT GOALS: Pt reports she would like to get rid of pain, be able to use her arm normally for everyday tasks, complete job duties  NEXT MD VISIT: unsure  OBJECTIVE:  Note: Objective measures were completed at Evaluation unless otherwise noted.  HAND DOMINANCE: Right  ADLs: Overall ADLs: Pt is independent with overall basic self care tasks.  She reports increased difficulty with lifting items, completing work tasks  at [employer]    UPPER EXTREMITY ROM:     Active ROM Right eval Left eval  Shoulder flexion WNL WNL  Shoulder abduction WNL WNL  Shoulder adduction    Shoulder  extension WNL WNL  Shoulder internal rotation    Shoulder external rotation    Elbow flexion WNL WNL  Elbow extension WNL WNL  Wrist flexion WNL WNL  Wrist extension WNL WNL  Wrist ulnar deviation    Wrist radial deviation    Wrist pronation 90 90  Wrist supination 88 90  (Blank rows = not tested)  Active ROM Right eval Left eval  Thumb MCP (0-60)    Thumb IP (0-80)    Thumb Radial abd/add (0-55)     Thumb Palmar abd/add (0-45)     Thumb Opposition to Small Finger     Index MCP (0-90)     Index PIP (0-100)     Index DIP  (0-70)      Long MCP (0-90)      Long PIP (0-100)      Long DIP (0-70)      Ring MCP (0-90)      Ring PIP (0-100)      Ring DIP (0-70)      Little MCP (0-90)      Little PIP (0-100)      Little DIP (0-70)      (Blank rows = not tested)   UPPER EXTREMITY MMT:     MMT Right eval Left eval  Shoulder flexion 4/5 5/5  Shoulder abduction 4/5 5/5  Shoulder adduction    Shoulder extension    Shoulder internal rotation    Shoulder external rotation    Middle trapezius    Lower trapezius    Elbow flexion 4/5 with pain 5/5  Elbow extension    Wrist flexion 4/5 5/5  Wrist extension 4/5 5/5  Wrist ulnar deviation    Wrist radial deviation    Wrist pronation    Wrist supination    (Blank rows = not tested)  HAND FUNCTION: Grip strength: Right: 50 lbs; Left: 98 lbs, Lateral pinch: Right: 9 lbs, Left: 18 lbs, and 3 point pinch: Right: 9 lbs, Left: 18 lbs 07/16/23 Grip strength: Right: 55lbs; Left: 98 lbs, Lateral pinch: Right: 15 lbs, Left: 18 lbs, and 3 point pinch: Right: 11 lbs, Left: 18 lbs  COORDINATION: Impaired due to increased numbness, tingling in right hand, wrist and fingers.    SENSATION: Pt reports numbness, tingling in elbow, wrist and digits. 07/16/23 report no numbness the last few wks   EDEMA: mild edema noted in forearm and right elbow  COGNITION: Overall cognitive status: Within functional limits for tasks assessed  TREATMENT DATE: 07/16/23                                                                                                                      Patient arrived with reports of increased discomfort and pain in the right shoulder. Patient was seen about a month ago. Upon assessment patient tenderness over anterior shoulder. Pain over lateral deltoid with shoulder abduction and flexion.  As well as external and internal rotation. Patient appeared to have some tendinitis and weakness in  the rotator cuff.?  Impingement.  Patient with tenderness over the  ulnar wrist as well as the medial epicondyle but less than a 3/10. Pain with resistance for ulnar deviation as well as wrist flexion less than 2-3/10 at ulnar wrist and mid epicondyle Patient with no positive Tinel at cubital tunnel or symptoms reported. Active range of motion in wrist forearm and elbow within normal limits But decreased strength in supination pronation, ulnar deviation as well as wrist extension.  Grip and prehension improved. Applied for patient Kinesiotape simulating a wrist widget-patient had less pain at ulnar wrist. Info provided for patient to get on [employer] to use at work.  Reviewed with patient modification and joint protection to not aggravate mid epicondyle as well as cubital tunnel on the right.  Simulate some of the tasks. Patient would benefit at this stage more of physical therapy for the right shoulder to decrease pain and increase strength do not compensate distally with wrist and elbow. Patient to follow-up with me 3 weeks into physical therapy.  PATIENT EDUCATION: Education details: contrast, ROM, taping Person educated: Patient Education method: Medical Illustrator Education comprehension: verbalized understanding  HOME EXERCISE PROGRAM: Pt to start with use of contrast at home to decrease inflammation and edema, ROM and foam roller  GOALS: Goals reviewed with patient? Yes  SHORT TERM GOALS: Target date: 06/20/2023  Pt will demonstrate HEP for RUE with modified independence  Baseline: no current program Goal status:progressing   LONG TERM GOALS: Target date: 07/11/2023  Patient to demonstrate decreased pain in right wrist and elbow for patient to perform ADLs pain-free.  Baseline: pain 8/10 with use at eval  NOW pain more at R shoulder Goal status:progressing   2.  Right elbow pain and symptoms in forearm to hand decreased to less than 2/10 with increase grip and prehension to play with kids and cook with more ease  Baseline: Pain  8/10 and increased difficulty with completion of these tasks. Goal status: progressing  3.  Right grip and prehension strength increased to within normal limits compared to the left for her age to return to perform cleaning, cooking and work with pain less than 2/10  Baseline:  Goal status: progressing  ASSESSMENT:  CLINICAL IMPRESSION: Patient is a 28 y.o. female who was seen month ago for occupational therapy evaluation for right elbow pain, ulnar nerve lesion, cubital tunnel syndrome and subacromial impingement of the right shoulder. Pt presented with epicondyle tenderness, pain in the elbow, forearm with supination, wrist and hand 8/10 overall with use, decreased strength and functional use for ADL and IADL tasks.  Pt reports a popping noise when moving digits, wrist and elbow and feels it is slowly moving up her arm to her shoulder.  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  Patient with less pain at medial epicondyle as well as ulnar wrist.  Negative cubital tunnel.  Patient show increased grip and prehension strength.  But still with decreased wrist with some symptoms at ulnar wrist and mid epicondyle.  Most of patient's symptoms at the moment at right shoulder.  Can increase to 10/10.  Would recommend patient to see physical therapy for right shoulder pain and strengthening and follow-up with me 3 weeks in the end.  With increased strengthening proximal would not had to compensate with over gripping distally.  Simulated with patient and educated on modification and joint protection at work.  Patient also wants to  start Pilates for pelvic health but recommend for her to work with physical therapy for the right shoulder first.  No popping noticed.  Pt would benefit from skilled OT services to maximize safety and independence in necessary daily tasks at work, home and school.    PERFORMANCE DEFICITS: in functional skills including ADLs, IADLs,  coordination, dexterity, sensation, edema, ROM, strength, pain, fascial restrictions, flexibility, body mechanics, and UE functional use,  and psychosocial skills including environmental adaptation, habits, and routines and behaviors.   IMPAIRMENTS: are limiting patient from ADLs, IADLs, rest and sleep, work, and social participation.   COMORBIDITIES: may have co-morbidities  that affects occupational performance. Patient will benefit from skilled OT to address above impairments and improve overall function.  MODIFICATION OR ASSISTANCE TO COMPLETE EVALUATION: Min-Moderate modification of tasks or assist with assess necessary to complete an evaluation.  OT OCCUPATIONAL PROFILE AND HISTORY: Detailed assessment: Review of records and additional review of physical, cognitive, psychosocial history related to current functional performance.  CLINICAL DECISION MAKING: Moderate - several treatment options, min-mod task modification necessary  REHAB POTENTIAL: Good  EVALUATION COMPLEXITY: Moderate      PLAN:  OT FREQUENCY: every 2-3 wks   OT DURATION: 6 weeks  PLANNED INTERVENTIONS: 97168 OT Re-evaluation, 97535 self care/ADL training, 02889 therapeutic exercise, 97530 therapeutic activity, 97112 neuromuscular re-education, 97140 manual therapy, 97035 ultrasound, 97018 paraffin, 02960 fluidotherapy, 97010 moist heat, 97010 cryotherapy, 97034 contrast bath, 97033 iontophoresis, 97760 Orthotics management and training, 02239 Splinting (initial encounter), and patient/family education  RECOMMENDED OTHER SERVICES: none currently  CONSULTED AND AGREED WITH PLAN OF CARE: Patient and family member/caregiver   Bassy Fetterly du [place], OTR/L, CLT  07/16/2023, 9:37 PM

## 2023-08-21 ENCOUNTER — Encounter: Payer: Self-pay | Admitting: Cardiovascular Disease

## 2023-08-21 ENCOUNTER — Ambulatory Visit: Attending: Cardiovascular Disease | Admitting: Cardiovascular Disease

## 2023-08-21 VITALS — BP 110/72 | HR 67 | Ht 62.0 in | Wt 250.8 lb

## 2023-08-21 DIAGNOSIS — Z87898 Personal history of other specified conditions: Secondary | ICD-10-CM

## 2023-08-21 DIAGNOSIS — N8003 Adenomyosis of the uterus: Secondary | ICD-10-CM

## 2023-08-21 DIAGNOSIS — J4520 Mild intermittent asthma, uncomplicated: Secondary | ICD-10-CM

## 2023-08-21 DIAGNOSIS — I517 Cardiomegaly: Secondary | ICD-10-CM

## 2023-08-21 DIAGNOSIS — Z01818 Encounter for other preprocedural examination: Secondary | ICD-10-CM

## 2023-08-21 NOTE — Patient Instructions (Signed)
 Medication Instructions:  No medication changes were made during today's visit.  *If you need a refill on your cardiac medications before your next appointment, please call your pharmacy*   Lab Work: No labs were ordered during today's visit.  If you have labs (blood work) drawn today and your tests are completely normal, you will receive your results only by: MyChart Message (if you have MyChart) OR A paper copy in the mail If you have any lab test that is abnormal or we need to change your treatment, we will call you to review the results.   Testing/Procedures: Your physician has requested that you have an echocardiogram. Echocardiography is a painless test that uses sound waves to create images of your heart. It provides your doctor with information about the size and shape of your heart and how well your heart's chambers and valves are working. This procedure takes approximately one hour. There are no restrictions for this procedure. Please do NOT wear cologne, perfume, aftershave, or lotions (deodorant is allowed). Please arrive 15 minutes prior to your appointment time.  Please note: We ask at that you not bring children with you during ultrasound (echo/ vascular) testing. Due to room size and safety concerns, children are not allowed in the ultrasound rooms during exams. Our front office staff cannot provide observation of children in our lobby area while testing is being conducted. An adult accompanying a patient to their appointment will only be allowed in the ultrasound room at the discretion of the ultrasound technician under special circumstances. We apologize for any inconvenience.    Follow-Up: At [facility], you and your health needs are our priority.  As part of our continuing mission to provide you with exceptional heart care, we have created designated Provider Care Teams.  These Care Teams include your primary Cardiologist (physician) and Advanced Practice  Providers (APPs -  Physician Assistants and Nurse Practitioners) who all work together to provide you with the care you need, when you need it.  We recommend signing up for the patient portal called "MyChart".  Sign up information is provided on this After Visit Summary.  MyChart is used to connect with patients for Virtual Visits (Telemedicine).  Patients are able to view lab/test results, encounter notes, upcoming appointments, etc.  Non-urgent messages can be sent to your provider as well.   To learn more about what you can do with MyChart, go to [email or web address].    Your next appointment:    As needed for Cardiac need  Provider:   Any available Provider     Other Instructions        1st Floor: - Lobby - Registration  - Pharmacy  - Lab - Cafe   2nd Floor: - PV Lab - Diagnostic Testing (echo, CT, nuclear med)   3rd Floor: - Vacant   4th Floor: - TCTS (cardiothoracic surgery) - AFib Clinic - Structural Heart Clinic - Vascular Surgery  - Vascular Ultrasound   5th Floor: - HeartCare Cardiology (general and EP) - Clinical Pharmacy for coumadin, hypertension, lipid, weight-loss medications, and med management appointments      Valet parking services will be available as well.       Thank you for choosing [facility]!

## 2023-08-26 ENCOUNTER — Encounter: Payer: Self-pay | Admitting: Cardiovascular Disease

## 2023-08-26 NOTE — Progress Notes (Signed)
 Cardiology Office Note    Date:  08/26/2023   ID:  Kara Collins, DOB 1996-01-28, MRN [id number]  PCP:  [facility]  Cardiologist:  Nicki Guadalajara, MD   New cardiology consultation referred by Dr. Jodean Lima at [facility] primary care for potential surgical clearance for possible tummy tuck and abdominal repair.   History of Present Illness:  Kara Collins is a 28 y.o. female who has a history of periumbilical abdominal pain and occasional headache.  She had been evaluated in the emergency room in October 2024.  Urinary pregnancy test was negative.  She was noted to have urine yeast and apparently was treated with Diflucan.  She was felt to have mild normocytic anemia which was not new.  Apparently, she had undergone evaluation by Dr. Burman Riis at [facility] [place] [facility].  She admitted to a longstanding history of pelvic pain and ultrasound was suggestive of adenomyosis.  She has a history of 3 prior C-sections.  She had a miscarriage, as well as 2 abortions.  She had also experienced pain with intercourse.  Her differential diagnosis included at adenomyosis of the uterus, endometriosis of the pelvic peritoneum or pelvic adhesive disease.  Apparently, Ms. Kilner had contacted a doctor in [place], [place] for possible tummy tuck surgery as well as abdominal muscle repair.  The patient has never met this physician but was contacting him due potential reduced cost and potential insurance benefit.  It was advised that she undergo surgical clearance.  She states she was told once that she had left atrial enlargement. She admits to experiencing palpitations in 2023.  She admits to a history of obesity for many years.  She denies any exertional chest tightness or pressure.  There is no presyncope or syncope.  She has a history of anemia.  Currently she is on albuterol as needed for wheezing.  She takes nystatin, and Kenalog ointment.  She has not  prescription for Lotrisone as needed.   Past Medical History:  Diagnosis Date   Abortion    Anemia    Ankle fracture 01/2021   Asthma    Hematoma 01/2021   abdominal    Past Surgical History:  Procedure Laterality Date   CESAREAN SECTION     DILATION AND CURETTAGE OF UTERUS      Current Medications: Outpatient Medications Prior to Visit  Medication Sig Dispense Refill   albuterol (VENTOLIN HFA) 108 (90 Base) MCG/ACT inhaler Inhale 2 puffs into the lungs every 4 (four) hours as needed for wheezing or shortness of breath. 18 g 0   clotrimazole-betamethasone (LOTRISONE) cream Apply to affected area 2 times daily prn 45 g 1   Multiple Vitamin (MULTI-VITAMIN) tablet Take 1 tablet by mouth daily.     nystatin (MYCOSTATIN/NYSTOP) powder Apply 1 Application topically 3 (three) times daily. 15 g 0   triamcinolone ointment (KENALOG) 0.1 % Apply 1 Application topically 2 (two) times daily. 15 g 0   Ferrous Sulfate 142 (45 Fe) MG TBCR Take 1 tablet by mouth daily. 90 tablet 1   metoCLOPramide (REGLAN) 10 MG tablet Take 1 tablet (10 mg total) by mouth every 8 (eight) hours as needed for nausea. 30 tablet 0   MOUNJARO 2.5 MG/0.5ML Pen SMARTSIG:2.5 Milligram(s) SUB-Q Once a Week (Patient not taking: Reported on 05/28/2022)     Vitamin D, Ergocalciferol, (DRISDOL) 1.25 MG (50000 UNIT) CAPS capsule Take 50,000 Units by mouth once a week.  No facility-administered medications prior to visit.     Allergies:   Aspirin, Cephalexin, Pollen extract, Sulfa antibiotics, and Zofran [ondansetron hcl]   Social History   Socioeconomic History   Marital status: Married    Spouse name: Not on file   Number of children: Not on file   Years of education: Not on file   Highest education level: Not on file  Occupational History   Not on file  Tobacco Use   Smoking status: Never   Smokeless tobacco: Never  Vaping Use   Vaping status: Never Used  Substance and Sexual Activity   Alcohol use: Never    Drug use: Never   Sexual activity: Yes    Birth control/protection: Condom  Other Topics Concern   Not on file  Social History Narrative   Not on file   Social Drivers of Health   Financial Resource Strain: Low Risk  (05/24/2022)   Received from [facility], [facility]   Overall Financial Resource Strain (CARDIA)    Difficulty of Paying Living Expenses: Not hard at all  Food Insecurity: No Food Insecurity (05/24/2022)   Received from [facility], [facility]   Hunger Vital Sign    Worried About Running Out of Food in the Last Year: Never true    Ran Out of Food in the Last Year: Never true  Transportation Needs: No Transportation Needs (02/09/2021)   Received from [facility], [facility]   PRAPARE - Transportation    Lack of Transportation (Medical): No    Lack of Transportation (Non-Medical): No  Physical Activity: Unknown (05/24/2022)   Received from [facility], [facility]   Exercise Vital Sign    Days of Exercise per Week: 0 days    Minutes of Exercise per Session: Not on file  Stress: No Stress Concern Present (05/24/2022)   Received from [facility], [facility] - Occupational Stress Questionnaire    Feeling of Stress : Only a little  Social Connections: Socially Integrated (05/24/2022)   Received from [facility], [facility]   Social Network    How would you rate your social network (family, work, friends)?: Good participation with social networks    She is married for 6 years.  She had 6 pregnancies with 3 children, 1 miscarriage and 2 abortions.  She works for [employer] at [employer].  There is no tobacco use.  She does not drink alcohol.  Recently she has been doing Pilates several days per week.  Family History:  The patient's family history includes Healthy in her father; Other in her mother.  Both parents are living, mother at age 96 who has a history of hypertension in father age 35  who also has hypertension.  Her maternal grandmother had fibroids and heart issues.  A paternal grand mother has diabetes.  She has 2 sisters ages 55 and 22.  She has 3 children ages 12, 59 and 5.  ROS General: Negative; No fevers, chills, or night sweats;  HEENT: Negative; No changes in vision or hearing, sinus congestion, difficulty swallowing Pulmonary: Negative; No cough, wheezing, shortness of breath, hemoptysis Cardiovascular: Negative; No chest pain, presyncope, syncope, palpitations GI: Negative; No nausea, vomiting, diarrhea, or abdominal pain GU: Negative; No dysuria, hematuria, or difficulty voiding Musculoskeletal: Negative; no myalgias, joint pain, or weakness Hematologic/Oncology: Negative; no easy bruising, bleeding Endocrine: Negative; no heat/cold intolerance; no diabetes Neuro: Negative; no changes in balance, headaches Skin: Negative; No  rashes or skin lesions Psychiatric: Negative; No behavioral problems, depression Sleep: Negative; No snoring, daytime sleepiness, hypersomnolence, bruxism, restless legs, hypnogognic hallucinations, no cataplexy Other comprehensive 14 point system review is negative.   PHYSICAL EXAM:   VS:  BP 110/72   Pulse 67   Ht 5\' 2"  (1.575 m)   Wt 250 lb 12.8 oz (113.8 kg)   LMP 07/31/2023   SpO2 99%   BMI 45.87 kg/m     Repeat blood pressure by me was 118/70  Wt Readings from Last 3 Encounters:  08/21/23 250 lb 12.8 oz (113.8 kg)  02/17/23 229 lb 8 oz (104.1 kg)  05/28/22 229 lb 8 oz (104.1 kg)    General: Alert, oriented, no distress.  Morbid obesity Skin: normal turgor, no rashes, warm and dry HEENT: Normocephalic, atraumatic. Pupils equal round and reactive to light; sclera anicteric; extraocular muscles intact; Nose without nasal septal hypertrophy Mouth/Parynx benign; Mallinpatti scale 3 Neck: Thick neck; no JVD, no carotid bruits; normal carotid upstroke Lungs: clear to ausculatation and percussion; no wheezing or rales Chest  wall:  without tenderness to palpitation Heart: PMI not displaced, RRR, s1 s2 normal, 1/6 systolic murmur, no diastolic murmur, no rubs, gallops, thrills, or heaves Abdomen: Central adiposity; soft, nontender; no hepatosplenomehaly, BS+; abdominal aorta nontender and not dilated by palpation. Back: no CVA tenderness Pulses 2+ Musculoskeletal: full range of motion, normal strength, no joint deformities Extremities: no clubbing cyanosis or edema, Homan's sign negative  Neurologic: grossly nonfocal; Cranial nerves grossly wnl Psychologic: Normal mood and affect   Studies/Labs Reviewed:   EKG Interpretation Date/Time:  Wednesday August 21 2023 09:50:57 EDT Ventricular Rate:  67 PR Interval:  150 QRS Duration:  94 QT Interval:  414 QTC Calculation: 437 R Axis:   7  Text Interpretation: Normal sinus rhythm RSR' or QR pattern in V1 suggests right ventricular conduction delay When compared with ECG of 07-Feb-2021 17:49, No significant change was found Confirmed by Nicki Guadalajara ([id number]) on 08/26/2023 11:33:57 AM   I personally reviewed a prior ECG of February 07, 2021 which showed normal sinus rhythm at 78 bpm, incomplete right bundle branch block.  Recent Labs:    Latest Ref Rng & Units 04/08/2023    9:29 AM 01/15/2022   12:01 PM 06/03/2020    7:13 PM  BMP  Glucose 70 - 99 mg/dL 93  90  94   BUN 6 - 20 mg/dL 12  10  11    Creatinine 0.44 - 1.00 mg/dL 1.91  4.78  2.95   Sodium 135 - 145 mmol/L 135  135  137   Potassium 3.5 - 5.1 mmol/L 4.1  3.9  4.3   Chloride 98 - 111 mmol/L 104  103  105   CO2 22 - 32 mmol/L 24  25  22    Calcium 8.9 - 10.3 mg/dL 9.0  8.6  9.0         Latest Ref Rng & Units 04/08/2023    9:29 AM 01/15/2022   12:01 PM 06/03/2020    7:13 PM  Hepatic Function  Total Protein 6.5 - 8.1 g/dL 8.5  8.0  7.9   Albumin 3.5 - 5.0 g/dL 4.4  4.3  4.2   AST 15 - 41 U/L 19  20  21    ALT 0 - 44 U/L 14  12  17    Alk Phosphatase 38 - 126 U/L 54  54  51   Total Bilirubin 0.3 - 1.2  mg/dL 0.5  0.3  0.1  Latest Ref Rng & Units 04/08/2023    9:29 AM 05/28/2022    3:30 PM 01/15/2022   12:01 PM  CBC  WBC 4.0 - 10.5 K/uL 3.6  5.7  4.0   Hemoglobin 12.0 - 15.0 g/dL 96.2  95.2  84.1   Hematocrit 36.0 - 46.0 % 36.2  38.3  35.0   Platelets 150 - 400 K/uL 305  311  303    Lab Results  Component Value Date   MCV 83.4 04/08/2023   MCV 81.5 05/28/2022   MCV 82.5 01/15/2022   Lab Results  Component Value Date   TSH 1.841 01/15/2022   No results found for: "HGBA1C"   BNP No results found for: "BNP"  ProBNP No results found for: "PROBNP"   Lipid Panel  No results found for: "CHOL", "TRIG", "HDL", "CHOLHDL", "VLDL", "LDLCALC", "LDLDIRECT", "LABVLDL"   RADIOLOGY: No results found.   Additional studies/ records that were reviewed today include:  I reviewed the records of her OB/GYN physician, Dr. Honor Loh    ASSESSMENT:    1. Mild intermittent asthma without complication   2. Pre-operative clearance   3. Enlarged LA (left atrium)   4. Morbid obesity (HCC)   5. History of palpitations   6. Adenomyosis    PLAN:  Ms. Fei is a 28 year old female who has a history of morbid obesity and previously has been pregnant on 6 occasions.  She has 3 children, and apparently had a miscarriage and 2 abortions.  She has had issues with chronic pelvic pain and in the past had endorsed heavy periods associated with cramps and large clots.  A pelvic ultrasound raise concern for possible adenomyosis.  She does have a history of mild asthma and takes albuterol on an as-needed basis.  She was planning to have a tummy tuck procedure as well as potential muscle repair of her abdomen.  She apparently has considering a physician in [place] who she has never met based on cost and insurance.  She tells me she was told of having left atrial enlargement in the past.  At times she mitts to a rare palpitation.  Presently, on exam she is in no acute distress.  Her blood  pressure is stable and on repeat by me was 118/70.  I have reviewed the records of her OB/GYN physician.  I have recommended that she undergo an echo Doppler study for assessment of LV function chamber dimensions as well as valvular architecture.  Her ECG today is stable.  I have strongly and encouraged her to consider getting an evaluation here in [place] of whether or not she really needs to have surgery and have suggested she see [facility] who is now part of [facility] for surgical evaluation locally.  She sees Dr. Jodean Lima of [facility] primary care.  I will contact her regarding her echo Doppler study.  At present I have not given definitive surgical clearance pending a surgical evaluation.  Medication Adjustments/Labs and Tests Ordered: Current medicines are reviewed at length with the patient today.  Concerns regarding medicines are outlined above.  Medication changes, Labs and Tests ordered today are listed in the Patient Instructions below.   Patient Instructions  Medication Instructions:  No medication changes were made during today's visit.  *If you need a refill on your cardiac medications before your next appointment, please call your pharmacy*   Lab Work: No labs were ordered during today's visit.  If you have labs (blood work) drawn today and your tests  are completely normal, you will receive your results only by: MyChart Message (if you have MyChart) OR A paper copy in the mail If you have any lab test that is abnormal or we need to change your treatment, we will call you to review the results.   Testing/Procedures: Your physician has requested that you have an echocardiogram. Echocardiography is a painless test that uses sound waves to create images of your heart. It provides your doctor with information about the size and shape of your heart and how well your heart's chambers and valves are working. This procedure takes approximately one hour. There are no restrictions for  this procedure. Please do NOT wear cologne, perfume, aftershave, or lotions (deodorant is allowed). Please arrive 15 minutes prior to your appointment time.  Please note: We ask at that you not bring children with you during ultrasound (echo/ vascular) testing. Due to room size and safety concerns, children are not allowed in the ultrasound rooms during exams. Our front office staff cannot provide observation of children in our lobby area while testing is being conducted. An adult accompanying a patient to their appointment will only be allowed in the ultrasound room at the discretion of the ultrasound technician under special circumstances. We apologize for any inconvenience.    Follow-Up: At [facility], you and your health needs are our priority.  As part of our continuing mission to provide you with exceptional heart care, we have created designated Provider Care Teams.  These Care Teams include your primary Cardiologist (physician) and Advanced Practice Providers (APPs -  Physician Assistants and Nurse Practitioners) who all work together to provide you with the care you need, when you need it.  We recommend signing up for the patient portal called "MyChart".  Sign up information is provided on this After Visit Summary.  MyChart is used to connect with patients for Virtual Visits (Telemedicine).  Patients are able to view lab/test results, encounter notes, upcoming appointments, etc.  Non-urgent messages can be sent to your provider as well.   To learn more about what you can do with MyChart, go to [email or web address].    Your next appointment:    As needed for Cardiac need  Provider:   Any available Provider     Other Instructions        1st Floor: - Lobby - Registration  - Pharmacy  - Lab - Cafe   2nd Floor: - PV Lab - Diagnostic Testing (echo, CT, nuclear med)   3rd Floor: - Vacant   4th Floor: - TCTS (cardiothoracic surgery) - AFib Clinic - Structural  Heart Clinic - Vascular Surgery  - Vascular Ultrasound   5th Floor: - HeartCare Cardiology (general and EP) - Clinical Pharmacy for coumadin, hypertension, lipid, weight-loss medications, and med management appointments      Valet parking services will be available as well.       Thank you for choosing [facility]!       Signed, Nicki Guadalajara, MD  08/26/2023 12:00 PM    [facility] [address], [place], [place]  [address] Phone: [phone]

## 2023-09-18 ENCOUNTER — Ambulatory Visit
Admission: EM | Admit: 2023-09-18 | Discharge: 2023-09-18 | Disposition: A | Discharge status: Home / Self Care | Attending: Family Medicine | Admitting: Family Medicine

## 2023-09-18 DIAGNOSIS — L304 Erythema intertrigo: Secondary | ICD-10-CM | POA: Diagnosis not present

## 2023-09-18 DIAGNOSIS — J45909 Unspecified asthma, uncomplicated: Secondary | ICD-10-CM | POA: Insufficient documentation

## 2023-09-18 MED ORDER — CLOTRIMAZOLE-BETAMETHASONE 1-0.05 % EX CREA: TOPICAL_CREAM | CUTANEOUS | 1 refills | Status: AC

## 2023-09-18 NOTE — ED Triage Notes (Signed)
 Pt c/o rash under bilateral axillas x2 mon. States first noticed when she changed her deodorant. Req refill on kenalog & nystatin.

## 2023-09-18 NOTE — Discharge Instructions (Signed)
 Stop by the pharmacy to pick up your prescriptions.  Follow up with your primary care provider or return to the urgent care, if not improving.

## 2023-09-18 NOTE — ED Provider Notes (Signed)
 [facility]    CSN: [id number] Arrival date & time: 09/18/23  1505      History   Chief Complaint Chief Complaint  Patient presents with   Rash    HPI Kara Collins is a 28 y.o. female.   HPI  Kara Collins presents for bilateral armpit rash that she noticed about a week ago and she had a similar rash 2 months ago.  She switched to Lume deodorant and has continued to sweat underneath her armpits.  She had similar symptoms previously that was treated with an ointment which she request a refill of. There is been no new products including soaps.  Denies arm pain or fever.      Past Medical History:  Diagnosis Date   Abortion    Anemia    Ankle fracture 01/2021   Asthma    Hematoma 01/2021   abdominal    Patient Active Problem List   Diagnosis Date Noted   Asthma without status asthmaticus 09/18/2023   Menorrhagia 05/29/2022   Raynaud's phenomenon without gangrene 01/15/2022   Hypocalcemia 01/15/2022   Fatigue 01/15/2022   Pelvic pain in female 12/13/2021   History of COVID-19 04/04/2021   Absolute anemia 02/13/2021   Closed fracture of left ankle with routine healing 02/13/2021   Cyst of ovary 10/27/2020   Pre-diabetes 10/17/2020   Abdominal pain 08/10/2020   Asthma, mild intermittent 06/28/2020   Chronic gastroesophageal reflux disease 06/28/2020   Chronic vertigo 03/23/2020   Morbid obesity with BMI of 40.0-44.9, adult (HCC) 04/29/2018   BV (bacterial vaginosis) 04/23/2018   Liveborn infant, of singleton pregnancy, born in hospital by cesarean delivery 07/14/2015   Hx of preeclampsia, prior pregnancy, currently pregnant 07/13/2015   Heart palpitations 04/12/2015   High risk pregnancy due to history of previous obstetrical problem, second trimester 01/21/2015   Elevated hemoglobin A1c 12/06/2014   Obesity affecting pregnancy, antepartum, third trimester 03/23/2014    Past Surgical History:  Procedure Laterality Date   CESAREAN SECTION      DILATION AND CURETTAGE OF UTERUS      OB History     Gravida  1   Para      Term      Preterm      AB      Living         SAB      IAB      Ectopic      Multiple      Live Births               Home Medications    Prior to Admission medications   Medication Sig Start Date End Date Taking? Authorizing Provider  albuterol (VENTOLIN HFA) 108 (90 Base) MCG/ACT inhaler Inhale 2 puffs into the lungs every 4 (four) hours as needed for wheezing or shortness of breath. 09/27/19  Yes Wellington Half, FNP  Multiple Vitamin (MULTI-VITAMIN) tablet Take 1 tablet by mouth daily.   Yes [provider]  clotrimazole-betamethasone (LOTRISONE) cream Apply to affected area 2 times daily prn 09/18/23   [place], DO  ursodiol (ACTIGALL) 250 MG tablet SMARTSIG:Tablet(s) By Mouth 08/13/23   [provider]    Family History Family History  Problem Relation Age of Onset   Other Mother    Healthy Father     Social History Social History   Tobacco Use   Smoking status: Never   Smokeless tobacco: Never  Vaping Use   Vaping status: Never Used  Substance  Use Topics   Alcohol use: Never   Drug use: Never     Allergies   Aspirin, Pollen extract, Sulfa antibiotics, Cephalexin, and Ondansetron hcl   Review of Systems Review of Systems :negative unless otherwise stated in HPI.      Physical Exam Triage Vital Signs ED Triage Vitals  Encounter Vitals Group     BP --      Systolic BP Percentile --      Diastolic BP Percentile --      Pulse --      Resp 09/18/23 1510 16     Temp --      Temp Source 09/18/23 1510 Oral     SpO2 --      Weight 09/18/23 1508 250 lb (113.4 kg)     Height 09/18/23 1508 5\' 2"  (1.575 m)     Head Circumference --      Peak Flow --      Pain Score 09/18/23 1514 0     Pain Loc --      Pain Education --      Exclude from Growth Chart --    No data found.  Updated Vital Signs BP 116/73 (BP Location: Right Arm)    Pulse 67   Temp 99.4 F (37.4 C) (Oral)   Resp 16   Ht 5\' 2"  (1.575 m)   Wt 113.4 kg   LMP 08/25/2023 (Exact Date)   SpO2 95%   BMI 45.73 kg/m   Visual Acuity Right Eye Distance:   Left Eye Distance:   Bilateral Distance:    Right Eye Near:   Left Eye Near:    Bilateral Near:     Physical Exam  GEN: alert, well appearing female, in no acute distress  RESP: no increased work of breathing MSK: normal ROM of bilateral shoulders, no edema in upper extremities, erythematous patch in bilateral axilla without fluctuance or induration  NEURO: alert, moves all extremities appropriately SKIN: warm and dry; bilateral axilla with hyperpigmented erythematous patch in the upper skin folds   UC Treatments / Results  Labs (all labs ordered are listed, but only abnormal results are displayed) Labs Reviewed - No data to display  EKG   Radiology No results found.  Procedures Procedures (including critical care time)  Medications Ordered in UC Medications - No data to display  Initial Impression / Assessment and Plan / UC Course  I have reviewed the triage vital signs and the nursing notes.  Pertinent labs & imaging results that were available during my care of the patient were reviewed by me and considered in my medical decision making (see chart for details).     Patient is a 28 y.o. femalewho presents for worsening axillary rash.  Overall, patient is well-appearing and well-hydrated.  Vital signs stable.  Kara Collins is afebrile.  Exam concerning for intertrigo.  Treat with antifungal with steroid ointment as below.  Suspect this is secondary to notable antiperspirant being used and only deodorant being used.  No sign of bacterial infection to suggest antibiotics at this time. Not likely viral exanthem.   Reviewed expectations regarding course of current medical issues.  All questions asked were answered.  Outlined signs and symptoms indicating need for more acute intervention.  Patient verbalized understanding. After Visit Summary given.   Final Clinical Impressions(s) / UC Diagnoses   Final diagnoses:  Intertrigo     Discharge Instructions      Stop by the pharmacy to pick up your prescriptions.  Follow up with your primary care provider or return to the urgent care, if not improving.       ED Prescriptions     Medication Sig Dispense Auth. Provider   clotrimazole-betamethasone (LOTRISONE) cream Apply to affected area 2 times daily prn 45 g Kron Everton, DO      PDMP not reviewed this encounter.              Araf Clugston, DO 09/25/23 1808

## 2023-09-25 ENCOUNTER — Ambulatory Visit (HOSPITAL_COMMUNITY): Admission: RE | Admit: 2023-09-25 | Source: Ambulatory Visit

## 2023-10-08 ENCOUNTER — Ambulatory Visit: Attending: Cardiovascular Disease

## 2023-10-08 DIAGNOSIS — I517 Cardiomegaly: Secondary | ICD-10-CM | POA: Diagnosis not present

## 2023-10-08 DIAGNOSIS — I081 Rheumatic disorders of both mitral and tricuspid valves: Secondary | ICD-10-CM

## 2023-10-08 LAB — ECHOCARDIOGRAM COMPLETE
AV Mean grad: 5 mmHg
AV Peak grad: 8.6 mmHg
Ao pk vel: 1.47 m/s
Area-P 1/2: 3.48 cm2
S' Lateral: 3.33 cm

## 2023-11-04 ENCOUNTER — Encounter: Payer: Self-pay | Admitting: Oncology

## 2023-11-11 ENCOUNTER — Encounter (HOSPITAL_BASED_OUTPATIENT_CLINIC_OR_DEPARTMENT_OTHER): Payer: Self-pay | Admitting: Pulmonary Disease

## 2023-11-11 ENCOUNTER — Ambulatory Visit (HOSPITAL_BASED_OUTPATIENT_CLINIC_OR_DEPARTMENT_OTHER): Admitting: Pulmonary Disease
